# Patient Record
Sex: Male | Born: 1955 | Race: White | Hispanic: No | Marital: Married | Smoking: Current every day smoker
Health system: Southern US, Community
[De-identification: ages and names within clinical notes are randomized; demographics above are authoritative.]

## PROBLEM LIST (undated history)

## (undated) DIAGNOSIS — K219 Gastro-esophageal reflux disease without esophagitis: Secondary | ICD-10-CM

## (undated) DIAGNOSIS — B182 Chronic viral hepatitis C: Secondary | ICD-10-CM

## (undated) DIAGNOSIS — K769 Liver disease, unspecified: Secondary | ICD-10-CM

## (undated) HISTORY — PX: UPPER GASTROINTESTINAL ENDOSCOPY: SHX188

## (undated) HISTORY — PX: APPENDECTOMY: SHX54

## (undated) HISTORY — PX: BACK SURGERY: SHX140

---

## 1993-02-07 HISTORY — PX: ANTERIOR CRUCIATE LIGAMENT REPAIR: SHX115

## 1994-02-07 HISTORY — PX: ANTERIOR CRUCIATE LIGAMENT REPAIR: SHX115

## 1997-05-25 ENCOUNTER — Emergency Department (HOSPITAL_COMMUNITY): Admission: EM | Admit: 1997-05-25 | Discharge: 1997-05-25 | Payer: Self-pay | Admitting: *Deleted

## 1998-03-16 ENCOUNTER — Ambulatory Visit (HOSPITAL_COMMUNITY): Admission: RE | Admit: 1998-03-16 | Discharge: 1998-03-16 | Payer: Self-pay | Admitting: *Deleted

## 1998-03-16 ENCOUNTER — Encounter: Payer: Self-pay | Admitting: *Deleted

## 1998-12-23 ENCOUNTER — Ambulatory Visit (HOSPITAL_COMMUNITY): Admission: RE | Admit: 1998-12-23 | Discharge: 1998-12-23 | Payer: Self-pay | Admitting: Gastroenterology

## 1998-12-23 ENCOUNTER — Encounter (INDEPENDENT_AMBULATORY_CARE_PROVIDER_SITE_OTHER): Payer: Self-pay | Admitting: Specialist

## 1999-03-05 ENCOUNTER — Encounter (INDEPENDENT_AMBULATORY_CARE_PROVIDER_SITE_OTHER): Payer: Self-pay | Admitting: Specialist

## 1999-03-05 ENCOUNTER — Ambulatory Visit (HOSPITAL_COMMUNITY): Admission: RE | Admit: 1999-03-05 | Discharge: 1999-03-05 | Payer: Self-pay | Admitting: Gastroenterology

## 1999-03-05 ENCOUNTER — Encounter: Payer: Self-pay | Admitting: Gastroenterology

## 1999-05-30 ENCOUNTER — Emergency Department (HOSPITAL_COMMUNITY): Admission: EM | Admit: 1999-05-30 | Discharge: 1999-05-30 | Payer: Self-pay | Admitting: Emergency Medicine

## 1999-06-02 ENCOUNTER — Encounter: Admission: RE | Admit: 1999-06-02 | Discharge: 1999-07-12 | Payer: Self-pay | Admitting: Family Medicine

## 1999-07-28 ENCOUNTER — Encounter: Payer: Self-pay | Admitting: Family Medicine

## 1999-07-28 ENCOUNTER — Ambulatory Visit (HOSPITAL_COMMUNITY): Admission: RE | Admit: 1999-07-28 | Discharge: 1999-07-28 | Payer: Self-pay | Admitting: Family Medicine

## 1999-08-12 ENCOUNTER — Encounter: Payer: Self-pay | Admitting: Family Medicine

## 1999-08-12 ENCOUNTER — Ambulatory Visit (HOSPITAL_COMMUNITY): Admission: RE | Admit: 1999-08-12 | Discharge: 1999-08-12 | Payer: Self-pay | Admitting: Family Medicine

## 1999-08-26 ENCOUNTER — Encounter: Payer: Self-pay | Admitting: Family Medicine

## 1999-08-26 ENCOUNTER — Ambulatory Visit (HOSPITAL_COMMUNITY): Admission: RE | Admit: 1999-08-26 | Discharge: 1999-08-26 | Payer: Self-pay | Admitting: Family Medicine

## 1999-12-13 ENCOUNTER — Encounter: Payer: Self-pay | Admitting: Neurosurgery

## 1999-12-13 ENCOUNTER — Ambulatory Visit (HOSPITAL_COMMUNITY): Admission: RE | Admit: 1999-12-13 | Discharge: 1999-12-13 | Payer: Self-pay | Admitting: Neurosurgery

## 1999-12-15 ENCOUNTER — Encounter: Payer: Self-pay | Admitting: Neurosurgery

## 1999-12-15 ENCOUNTER — Ambulatory Visit (HOSPITAL_COMMUNITY): Admission: RE | Admit: 1999-12-15 | Discharge: 1999-12-15 | Payer: Self-pay | Admitting: Neurosurgery

## 2000-01-03 ENCOUNTER — Encounter: Payer: Self-pay | Admitting: Neurosurgery

## 2000-01-04 ENCOUNTER — Encounter: Payer: Self-pay | Admitting: Neurosurgery

## 2000-01-04 ENCOUNTER — Encounter (INDEPENDENT_AMBULATORY_CARE_PROVIDER_SITE_OTHER): Payer: Self-pay | Admitting: Specialist

## 2000-01-04 ENCOUNTER — Ambulatory Visit (HOSPITAL_COMMUNITY): Admission: RE | Admit: 2000-01-04 | Discharge: 2000-01-05 | Payer: Self-pay | Admitting: Neurosurgery

## 2000-01-19 ENCOUNTER — Ambulatory Visit (HOSPITAL_COMMUNITY): Admission: RE | Admit: 2000-01-19 | Discharge: 2000-01-20 | Payer: Self-pay | Admitting: Neurosurgery

## 2000-01-20 ENCOUNTER — Encounter: Payer: Self-pay | Admitting: Neurosurgery

## 2000-04-20 ENCOUNTER — Encounter: Payer: Self-pay | Admitting: Neurosurgery

## 2000-04-20 ENCOUNTER — Ambulatory Visit (HOSPITAL_COMMUNITY): Admission: RE | Admit: 2000-04-20 | Discharge: 2000-04-20 | Payer: Self-pay | Admitting: Neurosurgery

## 2001-11-12 ENCOUNTER — Emergency Department (HOSPITAL_COMMUNITY): Admission: EM | Admit: 2001-11-12 | Discharge: 2001-11-12 | Payer: Self-pay | Admitting: Emergency Medicine

## 2001-11-13 ENCOUNTER — Ambulatory Visit (HOSPITAL_COMMUNITY): Admission: RE | Admit: 2001-11-13 | Discharge: 2001-11-13 | Payer: Self-pay | Admitting: Emergency Medicine

## 2002-12-08 ENCOUNTER — Emergency Department (HOSPITAL_COMMUNITY): Admission: EM | Admit: 2002-12-08 | Discharge: 2002-12-08 | Payer: Self-pay | Admitting: *Deleted

## 2003-06-16 ENCOUNTER — Emergency Department (HOSPITAL_COMMUNITY): Admission: EM | Admit: 2003-06-16 | Discharge: 2003-06-16 | Payer: Self-pay | Admitting: Emergency Medicine

## 2003-08-28 ENCOUNTER — Emergency Department (HOSPITAL_COMMUNITY): Admission: EM | Admit: 2003-08-28 | Discharge: 2003-08-28 | Payer: Self-pay | Admitting: Emergency Medicine

## 2005-07-21 IMAGING — CR DG CHEST 2V
2 series · 2 of 2 positions shown · non-contrast
Comparison: none

CLINICAL DATA: Left chest injury.  Swelling.  Chest pain and shortness of breath.  
 CHEST (TWO VIEWS)
 PA and lateral views of the chest are made without previous films for direct comparison at this time and show the aorta to be mildly elongated and minimally calcified.  The heart appears normal.  The lungs appear clear.  There is no pneumothorax or pleural effusion.  No definite rib fracture is seen.  The upper and mid body of the sternum are well seen and appear normal.  The xiphoid area is not seen on this study in the lateral projection.  
 IMPRESSION
 Mild aortic elongation and calcification.  No evidence of acute disease of the chest.  No pneumothorax or pleural effusion is identified.  The lungs appear clear.  The heart is normal.

[view not recorded (1 of 2)]
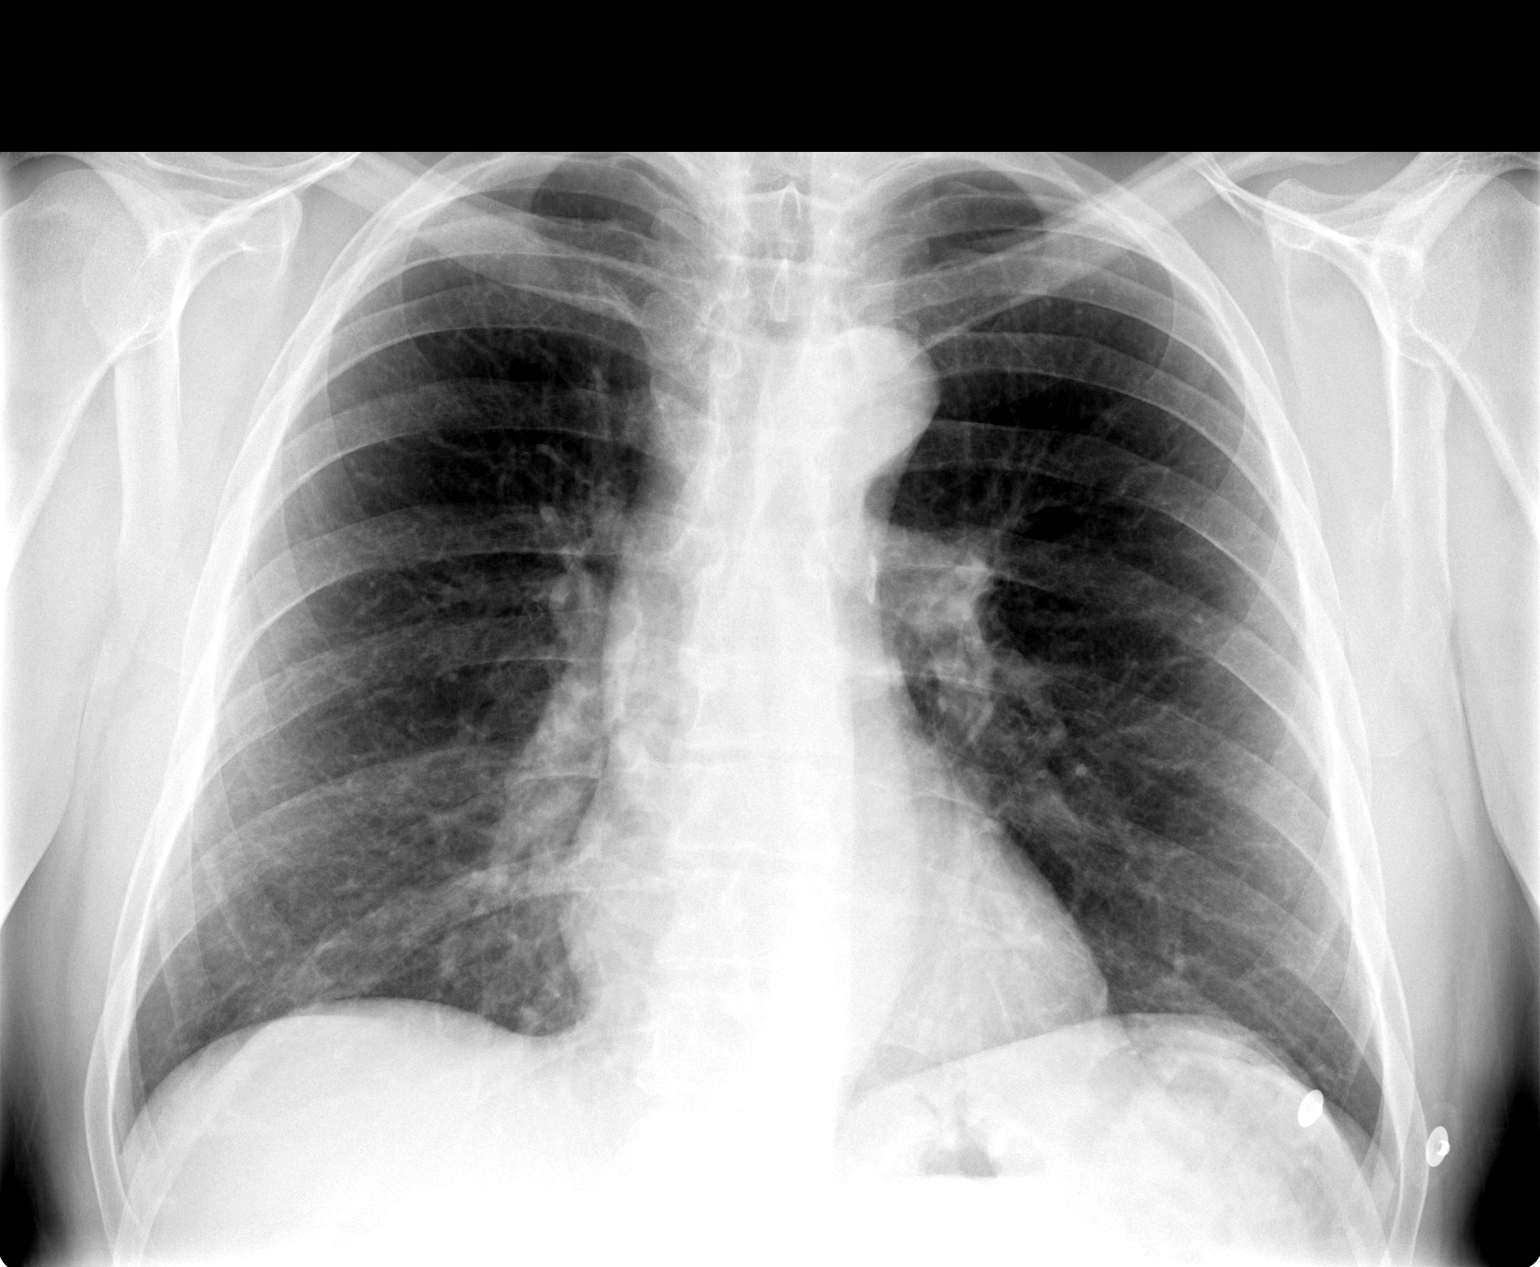

[view not recorded (2 of 2)]
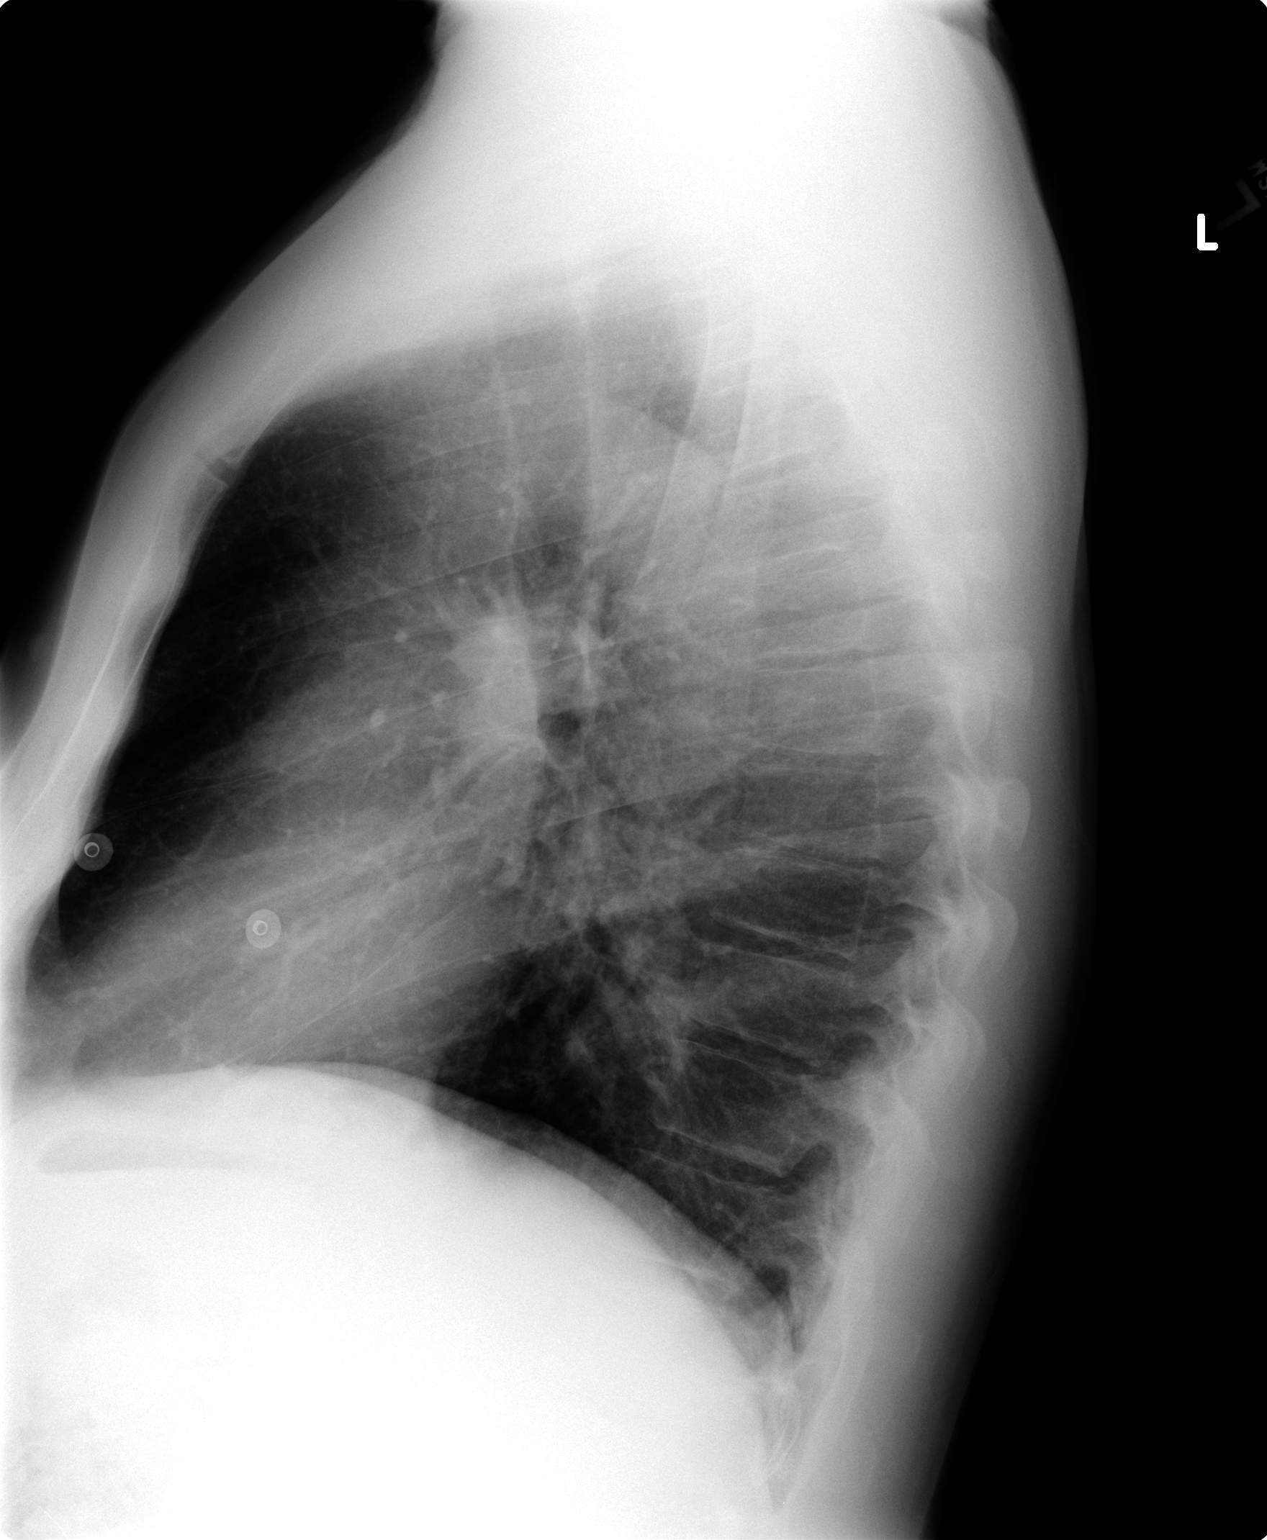

[2 of 2 positions shown; findings below may reference images not displayed]

## 2006-12-16 ENCOUNTER — Emergency Department (HOSPITAL_COMMUNITY): Admission: EM | Admit: 2006-12-16 | Discharge: 2006-12-16 | Payer: Self-pay | Admitting: Emergency Medicine

## 2009-03-10 ENCOUNTER — Ambulatory Visit: Payer: Self-pay | Admitting: Diagnostic Radiology

## 2009-03-10 ENCOUNTER — Emergency Department (HOSPITAL_BASED_OUTPATIENT_CLINIC_OR_DEPARTMENT_OTHER): Admission: EM | Admit: 2009-03-10 | Discharge: 2009-03-10 | Payer: Self-pay | Admitting: Emergency Medicine

## 2009-03-18 ENCOUNTER — Emergency Department (HOSPITAL_BASED_OUTPATIENT_CLINIC_OR_DEPARTMENT_OTHER): Admission: EM | Admit: 2009-03-18 | Discharge: 2009-03-18 | Payer: Self-pay | Admitting: Emergency Medicine

## 2009-10-18 ENCOUNTER — Emergency Department (HOSPITAL_BASED_OUTPATIENT_CLINIC_OR_DEPARTMENT_OTHER): Admission: EM | Admit: 2009-10-18 | Discharge: 2009-10-18 | Payer: Self-pay | Admitting: Emergency Medicine

## 2009-11-15 ENCOUNTER — Emergency Department (HOSPITAL_BASED_OUTPATIENT_CLINIC_OR_DEPARTMENT_OTHER): Admission: EM | Admit: 2009-11-15 | Discharge: 2009-11-15 | Payer: Self-pay | Admitting: Emergency Medicine

## 2009-11-15 ENCOUNTER — Ambulatory Visit: Payer: Self-pay | Admitting: Diagnostic Radiology

## 2010-04-22 LAB — POCT CARDIAC MARKERS
Myoglobin, poc: 33.4 ng/mL (ref 12–200)
Troponin i, poc: <0.05 ng/mL (ref 0.00–0.09)
Troponin i, poc: <0.05 ng/mL (ref 0.00–0.09)

## 2010-04-22 LAB — BASIC METABOLIC PANEL
Calcium: 8.8 mg/dL (ref 8.4–10.5)
Creatinine, Ser: 1.2 mg/dL (ref 0.4–1.5)
GFR calc Af Amer: >60 mL/min (ref >60)
GFR calc non Af Amer: >60 mL/min (ref >60)
Sodium: 140 mEq/L (ref 135–145)

## 2010-04-22 LAB — CBC
Hemoglobin: 14.3 g/dL (ref 13.0–17.0)
Platelets: 76 10*3/uL — ABNORMAL LOW (ref 150–400)
RBC: 4.35 MIL/uL (ref 4.22–5.81)
WBC: 5.9 10*3/uL (ref 4.0–10.5)

## 2010-04-22 LAB — RAPID STREP SCREEN (MED CTR MEBANE ONLY): Streptococcus, Group A Screen (Direct): NEGATIVE

## 2010-04-22 LAB — DIFFERENTIAL
Eosinophils Absolute: 0.2 10*3/uL (ref 0.0–0.7)
Lymphs Abs: 1.8 10*3/uL (ref 0.7–4.0)
Monocytes Relative: 10 % (ref 3–12)
Neutro Abs: 3.3 10*3/uL (ref 1.7–7.7)
Neutrophils Relative %: 56 % (ref 43–77)

## 2010-04-22 LAB — D-DIMER, QUANTITATIVE: D-Dimer, Quant: <0.22 ug/mL-FEU (ref 0.00–0.48)

## 2010-06-25 NOTE — Op Note (Signed)
[place]  Patient:    Tom Chang, SCHLOEMER                     MRN: [id number] Proc. Date: 01/19/00 Adm. Date:  [id number] Disc. Date: [id number] Attending:  Donalee Citrin P                           Operative Report  PREOPERATIVE DIAGNOSIS:  Wound infection.  PROCEDURE:  Incision and drainage of wound infection, status post lumbar laminectomy two weeks ago.  SURGEON:  Donzetta Sprung. Roney Jaffe., M.D.  ANESTHESIA:  General endotracheal.  IV FLUIDS:  Less than 1 L.  ESTIMATED BLOOD LOSS:  Minimal.  CLINICAL NOTE:  The patient is a very pleasant 55 year old gentleman who underwent lumbar laminectomy for removal of an L3-4 facet mass approximately two weeks ago.  Presented to clinic with a swollen wound, erythematous, indurated, and fever of 102.  The patient was emergently brought to the OR that afternoon and underwent reopening of previous incision, and purulent fluid was noted to be immediately visible from underneath the superficial skin layer.  This was sent for culture, both aerobic and anaerobic, and the deep layer of fascia was opened up and explored.  There was also purulent fluid noted to be subfascial.  This was copiously irrigated with 6 L of bacitracin irrigation through a Pulsavac.  At the end of the irrigation, the remainder of the necrotic tissue was incised away, and the skin edge was re-incised to get a fresh skin edge for closure.  The tissue was noted to be bleeding profusely, and this was coagulated but noted to have good vascularization.  At the end, meticulous hemostasis was maintained.  The wound was closed again, the deep layer with 0 interrupted Vicryl, 2-0 interrupted Vicryl in the subcutaneous tissue, and a 4-0 running subcuticular in the skin.  Benzoin and Steri-Strips were applied, and the patient went to the recovery room in stable condition. At the end of the case, all needle counts and sponge counts were correct.  DESCRIPTION  OF PROCEDURE: DD:  02/03/00 TD:  02/03/00 Job: [id number]

## 2010-06-25 NOTE — Op Note (Signed)
[place]. [facility]  Patient:    Tom Chang, Tom Chang                     MRN: [id number] Adm. Date:  [id number] Attending:  Donalee Citrin P                           Operative Report  PREOPERATIVE DIAGNOSIS:  L3-4 facet mass.  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  PROCEDURE:  Lumbar laminectomy and medial facetectomy for excision of well-circumscribed mass at the L3-4 facet.  SURGEON:  Donzetta Sprung. Roney Jaffe., M.D.  ASSISTANT:  Reinaldo Meeker, M.D.  INTRAOPERATIVE FLUIDS:  1200.  ESTIMATED BLOOD LOSS:  Less than 150.  CLINICAL NOTE:  The patient is a very pleasant 55 year old gentleman who injured his back at work several months ago and has failed all trials of conservative therapy.  Initial MRI was read as negative.  Follow-up MRI revealed an enlarging cystic mass located at the lateral L3-4 facet complex, based off of the superior articulating facet of L4 at the level of the L4 pedicle.  The patient did not respond to all anti-inflammatories.  DESCRIPTION OF PROCEDURE:  The patient was brought in the OR, was administered general anesthesia, was positioned prone in the Hazen frame.  His back was prepped and draped in the usual sterile fashion.  A midline incision was made after localization of the L4 pedicle with preoperative x-ray.  With an 11 blade scalpel, an incision was taken down through subcutaneous tissue on the right side.  Subperiosteal dissection was carried out on the lamina of L3 and L4, exposing the facet complex.  There was a large bony protuberance coming off the medial aspect of the facet complex and the superior aspect of the L4 lamina.  This was further dissected out and identified.  Intraoperative x-ray confirmed the L4 pedicle, and this laminotomy was begun on the inferior aspect of L3, exposing the ligamentum flavum, and Leksell rongeur was used to remove part of this large bony mass, sent for pathology.  The remainder of  the mass was then freed up off the medial aspect of the facet, and it was noted to be well-circumscribed, not invading the soft tissues.  It appeared to be entirely just involving the bone of the lamina and the medial aspect of the facet.  The remainder of this mass was removed en bloc with just a little bit of residual remaining tethered to the L3 lamina.  This was removed in piecemeal fashion with a 2 and 3 mm Kerrison punch.  At the end of the resection, the mass was no longer present and was completely removed.  The dura was then visualized, and it was noted to be not under any compression.  The wound was copiously irrigated and meticulous hemostasis was maintained.  The residual aspect of the facet complex of L3 was coagulated, and bone wax was used to maintain hemostasis.  Gelfoam was overlaid, and the wound was copiously irrigated and then closed with 0 interrupted Vicryl in the fascia, 2-0 and 3-0 interrupted Vicryl in the subcutaneous tissue, and a 4-0 running subcuticular.  Prior to closure, the towel clip was attached to the L3 and L4 spinous processes, and very minimal movement was noted at this complex.  It was felt this was stable and did not require additional fusion, and a _____ dressing was applied, benzoin and Steri-Strips, and the patient went  to the recovery room in stable condition. DD:  01/04/00 TD:  01/04/00 Job: [id number]

## 2010-07-26 ENCOUNTER — Emergency Department (HOSPITAL_BASED_OUTPATIENT_CLINIC_OR_DEPARTMENT_OTHER)
Admission: EM | Admit: 2010-07-26 | Discharge: 2010-07-26 | Disposition: A | Discharge status: Home / Self Care | Payer: 59 | Attending: Emergency Medicine | Admitting: Emergency Medicine

## 2010-07-26 DIAGNOSIS — K089 Disorder of teeth and supporting structures, unspecified: Secondary | ICD-10-CM | POA: Insufficient documentation

## 2010-07-26 DIAGNOSIS — M549 Dorsalgia, unspecified: Secondary | ICD-10-CM | POA: Insufficient documentation

## 2010-07-26 DIAGNOSIS — G8929 Other chronic pain: Secondary | ICD-10-CM | POA: Insufficient documentation

## 2010-10-06 ENCOUNTER — Emergency Department (HOSPITAL_BASED_OUTPATIENT_CLINIC_OR_DEPARTMENT_OTHER)
Admission: EM | Admit: 2010-10-06 | Discharge: 2010-10-06 | Disposition: A | Discharge status: Home / Self Care | Payer: 59 | Attending: Emergency Medicine | Admitting: Emergency Medicine

## 2010-10-06 ENCOUNTER — Encounter: Payer: Self-pay | Admitting: *Deleted

## 2010-10-06 ENCOUNTER — Emergency Department (INDEPENDENT_AMBULATORY_CARE_PROVIDER_SITE_OTHER): Payer: 59

## 2010-10-06 DIAGNOSIS — S2341XA Sprain of ribs, initial encounter: Secondary | ICD-10-CM

## 2010-10-06 DIAGNOSIS — R079 Chest pain, unspecified: Secondary | ICD-10-CM

## 2010-10-06 DIAGNOSIS — W010XXA Fall on same level from slipping, tripping and stumbling without subsequent striking against object, initial encounter: Secondary | ICD-10-CM | POA: Insufficient documentation

## 2010-10-06 DIAGNOSIS — Y9289 Other specified places as the place of occurrence of the external cause: Secondary | ICD-10-CM | POA: Insufficient documentation

## 2010-10-06 DIAGNOSIS — F17200 Nicotine dependence, unspecified, uncomplicated: Secondary | ICD-10-CM | POA: Insufficient documentation

## 2010-10-06 HISTORY — DX: Gastro-esophageal reflux disease without esophagitis: K21.9

## 2010-10-06 MED ORDER — OXYCODONE-ACETAMINOPHEN 5-325 MG PO TABS
2.0000 | ORAL_TABLET | Freq: Once | ORAL | Status: DC
  Filled 2010-10-06: qty 2

## 2010-10-06 MED ORDER — OXYCODONE-ACETAMINOPHEN 5-325 MG PO TABS: 1.0000 | ORAL_TABLET | Freq: Four times a day (QID) | ORAL | Status: AC | PRN

## 2010-10-06 NOTE — ED Provider Notes (Signed)
History     CSN: [id number] Arrival date & time: 10/06/2010  7:57 AM  Chief Complaint  Patient presents with  . Chest Pain   HPI Comments: The history is obtained from the patient. The patient reports about 10 days ago he slipped on some wet grass and fell over and over small tree trunk hitting his left rib cage. Patient reports he did have pain in that area as well as bruising. The bruising and pain did improve in a few days ago he was working in the yard rather strenuously. He did a lot of lifting and pushing which is his usual. Reports that he had a sudden increase in pain following physical exertion focal to that area again. She reports now with deep breathing or coughing he feels like there is bone that is moving in the in that area as well as hearing a bony crunching noise. She denies any fever or significant productive cough. He does have sensation to cough and it is difficult to do so. He denies any abdominal discomfort, lightheadedness, sweats or nausea. He has not been taking any medications for this. The patient is otherwise healthy except for he has had multiple orthopedic surgeries in the past. She reports the other physical exam is primary care Dr. at the [facility] in [place] and did fine.  Patient is a 55 y.o. male presenting with chest pain.  Chest Pain Pertinent negatives for primary symptoms include no shortness of breath, no wheezing, no palpitations, no abdominal pain, no nausea and no vomiting.  Pertinent negatives for associated symptoms include no numbness and no weakness.     Past Medical History  Diagnosis Date  . Acid reflux     Past Surgical History  Procedure Date  . Upper gastrointestinal endoscopy   . Back surgery   . Anterior cruciate ligament repair 1996    x 2 left knee  . Appendectomy     History reviewed. No pertinent family history.  History  Substance Use Topics  . Smoking status: Current Everyday Smoker -- 0.5 packs/day for 40 years    Types:  Cigarettes  . Smokeless tobacco: Not on file  . Alcohol Use: 2.4 oz/week    4 Cans of beer per week     drinks weekends only      Review of Systems  HENT: Negative for congestion and rhinorrhea.   Respiratory: Negative for chest tightness, shortness of breath, wheezing and stridor.   Cardiovascular: Positive for chest pain. Negative for palpitations and leg swelling.  Gastrointestinal: Negative for nausea, vomiting, abdominal pain and abdominal distention.  Genitourinary: Negative for flank pain.  Musculoskeletal: Negative for back pain.  Neurological: Negative for weakness and numbness.  All other systems reviewed and are negative.    Physical Exam  BP 118/72  Pulse 71  Temp(Src) 98.3 F (36.8 C) (Oral)  Resp 24  Ht 6\' 1"  (1.854 m)  Wt 225 lb (102.059 kg)  BMI 29.69 kg/m2  SpO2 94%  Physical Exam  Constitutional: He appears well-developed and well-nourished.  HENT:  Head: Atraumatic.  Eyes: Pupils are equal, round, and reactive to light.  Neck: Normal range of motion. Neck supple.  Cardiovascular: Normal rate and regular rhythm.   Pulmonary/Chest: Effort normal. No respiratory distress. He has no wheezes. He exhibits tenderness.  Abdominal: He exhibits no distension. There is no tenderness. There is no rebound and no guarding.  Musculoskeletal: Normal range of motion. He exhibits no tenderness.  Skin: Skin is warm and dry. No  rash noted.  Psychiatric: He has a normal mood and affect.    ED Course  Procedures  MDM Plain films of ribs shows no acute fractures. Pt is reassured.  sats are normal on RA.  Pt refused percocet here.  Will refer him back to PCP for follow up      Gavin Pound. Oletta Lamas, MD 10/06/10 [phone]

## 2010-10-06 NOTE — ED Notes (Signed)
Patient states he was walking down a hill on Saturday 09/25/10 , the grass was wet and he slipped and fell over a tree striking his left chest.  States he has had increasing pain and now is feeling sob and a "popping" in his chest with each breath.

## 2010-10-17 ENCOUNTER — Encounter (HOSPITAL_BASED_OUTPATIENT_CLINIC_OR_DEPARTMENT_OTHER): Payer: Self-pay | Admitting: Emergency Medicine

## 2010-10-17 ENCOUNTER — Emergency Department (HOSPITAL_BASED_OUTPATIENT_CLINIC_OR_DEPARTMENT_OTHER)
Admission: EM | Admit: 2010-10-17 | Discharge: 2010-10-17 | Disposition: A | Discharge status: Home / Self Care | Payer: 59 | Attending: Emergency Medicine | Admitting: Emergency Medicine

## 2010-10-17 DIAGNOSIS — F17200 Nicotine dependence, unspecified, uncomplicated: Secondary | ICD-10-CM | POA: Insufficient documentation

## 2010-10-17 DIAGNOSIS — M25569 Pain in unspecified knee: Secondary | ICD-10-CM | POA: Insufficient documentation

## 2010-10-17 DIAGNOSIS — L089 Local infection of the skin and subcutaneous tissue, unspecified: Secondary | ICD-10-CM | POA: Insufficient documentation

## 2010-10-17 MED ORDER — HYDROMORPHONE HCL 1 MG/ML IJ SOLN
1.0000 mg | Freq: Once | INTRAMUSCULAR | Status: AC
  Administered 2010-10-17: 1 mg via INTRAVENOUS
  Filled 2010-10-17: qty 1

## 2010-10-17 MED ORDER — DOXYCYCLINE HYCLATE 100 MG PO CAPS: 100.0000 mg | ORAL_CAPSULE | Freq: Two times a day (BID) | ORAL | Status: AC

## 2010-10-17 MED ORDER — DOXYCYCLINE HYCLATE 100 MG PO TABS
100.0000 mg | ORAL_TABLET | Freq: Once | ORAL | Status: AC
  Administered 2010-10-17: 100 mg via ORAL
  Filled 2010-10-17 (×2): qty 1

## 2010-10-17 MED ORDER — ONDANSETRON HCL 4 MG/2ML IJ SOLN
4.0000 mg | Freq: Once | INTRAMUSCULAR | Status: AC
  Administered 2010-10-17: 4 mg via INTRAVENOUS
  Filled 2010-10-17: qty 2

## 2010-10-17 MED ORDER — CEPHALEXIN 500 MG PO CAPS: 500.0000 mg | ORAL_CAPSULE | Freq: Four times a day (QID) | ORAL | Status: AC

## 2010-10-17 MED ORDER — SODIUM CHLORIDE 0.9 % IV SOLN
Freq: Once | INTRAVENOUS | Status: AC
  Administered 2010-10-17: 1000 mL via INTRAVENOUS

## 2010-10-17 MED ORDER — OXYCODONE-ACETAMINOPHEN 5-325 MG PO TABS: 2.0000 | ORAL_TABLET | ORAL | Status: AC | PRN

## 2010-10-17 MED ORDER — DEXTROSE 5 % IV SOLN
1.0000 g | INTRAVENOUS | Status: DC
  Administered 2010-10-17: 1 g via INTRAVENOUS

## 2010-10-17 MED ORDER — DEXTROSE 5 % IV SOLN
INTRAVENOUS | Status: AC
  Filled 2010-10-17: qty 50

## 2010-10-17 MED ORDER — DEXTROSE 5 % IV SOLN
INTRAVENOUS | Status: AC
  Administered 2010-10-17: 1 g via INTRAVENOUS
  Filled 2010-10-17: qty 1

## 2010-10-17 NOTE — ED Provider Notes (Signed)
Medical screening examination/treatment/procedure(s) were performed by non-physician practitioner and as supervising physician I was immediately available for consultation/collaboration.  Doug Sou, MD 10/17/10 [phone]

## 2010-10-17 NOTE — ED Notes (Signed)
Pt states he was stung by catfish horn.  Pt has redness, swelling and inflammation to right knee.  Pt states it is very painful.

## 2010-10-17 NOTE — ED Provider Notes (Signed)
History     CSN: [id number] Arrival date & time: 10/17/2010 10:58 AM  Chief Complaint  Patient presents with  . Wound Infection  . Knee Pain   Patient is a 55 y.o. male presenting with knee pain. The history is provided by the patient. No language interpreter was used.  Knee Pain The current episode started in the past 7 days. The problem occurs constantly. The problem has been unchanged. Associated symptoms include joint swelling. The symptoms are aggravated by walking. He has tried nothing for the symptoms. The treatment provided moderate relief.  Pt complains of pain and swelling to left knee after being struck by a catfish barb.   Pt reports area is now red and swollen.  Pt complains of pain with moving.  Past Medical History  Diagnosis Date  . Acid reflux     Past Surgical History  Procedure Date  . Upper gastrointestinal endoscopy   . Back surgery   . Anterior cruciate ligament repair 1996    x 2 left knee  . Appendectomy     History reviewed. No pertinent family history.  History  Substance Use Topics  . Smoking status: Current Everyday Smoker -- 0.5 packs/day for 40 years    Types: Cigarettes  . Smokeless tobacco: Not on file  . Alcohol Use: 2.4 oz/week    4 Cans of beer per week     drinks weekends only      Review of Systems  Musculoskeletal: Positive for joint swelling.  All other systems reviewed and are negative.    Physical Exam  BP 107/67  Pulse 65  Temp(Src) 99.5 F (37.5 C) (Oral)  Resp 22  Ht 6\' 1"  (1.854 m)  Wt 230 lb (104.327 kg)  BMI 30.34 kg/m2  SpO2 94%  Physical Exam  Nursing note and vitals reviewed. Constitutional: He is oriented to person, place, and time. He appears well-developed and well-nourished.  HENT:  Head: Normocephalic.  Eyes: Pupils are equal, round, and reactive to light.  Neck: Normal range of motion.  Pulmonary/Chest: Effort normal.  Abdominal: Soft.  Musculoskeletal: He exhibits edema and tenderness.   Red swollen area left knee,  Warm to touch  Neurological: He is alert and oriented to person, place, and time. He has normal reflexes.  Skin: There is erythema.  Psychiatric: He has a normal mood and affect.    ED Course  Procedures  MDM  Pt advised to return here or see Dr. Ivory Broad for recheck tommoros.      Langston Masker, [place] 10/17/10 1441

## 2010-10-18 ENCOUNTER — Encounter (HOSPITAL_BASED_OUTPATIENT_CLINIC_OR_DEPARTMENT_OTHER): Payer: Self-pay | Admitting: Student

## 2010-10-18 ENCOUNTER — Emergency Department (HOSPITAL_BASED_OUTPATIENT_CLINIC_OR_DEPARTMENT_OTHER)
Admission: EM | Admit: 2010-10-18 | Discharge: 2010-10-18 | Disposition: A | Discharge status: Home / Self Care | Payer: 59 | Attending: Emergency Medicine | Admitting: Emergency Medicine

## 2010-10-18 DIAGNOSIS — M25569 Pain in unspecified knee: Secondary | ICD-10-CM | POA: Insufficient documentation

## 2010-10-18 DIAGNOSIS — F17200 Nicotine dependence, unspecified, uncomplicated: Secondary | ICD-10-CM | POA: Insufficient documentation

## 2010-10-18 DIAGNOSIS — L0390 Cellulitis, unspecified: Secondary | ICD-10-CM

## 2010-10-18 DIAGNOSIS — L02419 Cutaneous abscess of limb, unspecified: Secondary | ICD-10-CM | POA: Insufficient documentation

## 2010-10-18 MED ORDER — HYDROMORPHONE HCL 1 MG/ML IJ SOLN
1.0000 mg | Freq: Once | INTRAMUSCULAR | Status: AC
  Administered 2010-10-18: 1 mg via INTRAVENOUS
  Filled 2010-10-18: qty 1

## 2010-10-18 MED ORDER — ONDANSETRON HCL 4 MG/2ML IJ SOLN
4.0000 mg | Freq: Once | INTRAMUSCULAR | Status: AC
  Administered 2010-10-18: 4 mg via INTRAVENOUS
  Filled 2010-10-18: qty 2

## 2010-10-18 MED ORDER — DEXTROSE 5 % IV SOLN
1.0000 g | INTRAVENOUS | Status: DC
  Administered 2010-10-18: 1 g via INTRAVENOUS
  Filled 2010-10-18: qty 1

## 2010-10-18 MED ORDER — VANCOMYCIN HCL IN DEXTROSE 1-5 GM/200ML-% IV SOLN
1000.0000 mg | Freq: Once | INTRAVENOUS | Status: AC
  Administered 2010-10-18: 1000 mg via INTRAVENOUS
  Filled 2010-10-18: qty 200

## 2010-10-18 NOTE — ED Notes (Signed)
Pt seen here yesterday for pain to right knee s/p catfish sting. Pt sts he did get his Rx's filled but took them this a.m. on an empty stomach and vomited them back up. Pt sts he was able to eat later and then took his antibiotics but not the pain pill. Wound does not appear to have spread beyond outline made yesterday. Knee is still warm to the touch.

## 2010-10-18 NOTE — ED Provider Notes (Signed)
History     CSN: [id number] Arrival date & time: 10/18/2010  3:42 PM  Chief Complaint  Patient presents with  . Knee Pain    right knee   Patient is a 55 y.o. male presenting with knee pain. The history is provided by the patient. No language interpreter was used.  Knee Pain This is a new problem. The current episode started in the past 7 days. The problem occurs constantly. The problem has been gradually worsening. Associated symptoms include myalgias. The symptoms are aggravated by nothing. He has tried nothing for the symptoms. The treatment provided moderate relief.  Pt here for recheck of cellulitis to right leg.  Past Medical History  Diagnosis Date  . Acid reflux     Past Surgical History  Procedure Date  . Upper gastrointestinal endoscopy   . Back surgery   . Anterior cruciate ligament repair 1996    x 2 left knee  . Appendectomy     History reviewed. No pertinent family history.  History  Substance Use Topics  . Smoking status: Current Everyday Smoker -- 0.5 packs/day for 40 years    Types: Cigarettes  . Smokeless tobacco: Not on file  . Alcohol Use: 2.4 oz/week    4 Cans of beer per week     drinks weekends only      Review of Systems  Musculoskeletal: Positive for myalgias.  All other systems reviewed and are negative.    Physical Exam  BP 125/92  Pulse 85  Temp(Src) 99.9 F (37.7 C) (Oral)  Resp 18  Wt 230 lb (104.327 kg)  SpO2 95%  Physical Exam  Nursing note and vitals reviewed. Constitutional: He is oriented to person, place, and time. He appears well-developed and well-nourished.  HENT:  Head: Normocephalic.  Eyes: Pupils are equal, round, and reactive to light.  Musculoskeletal: He exhibits tenderness.  Neurological: He is alert and oriented to person, place, and time.  Skin: There is erythema.  Psychiatric: He has a normal mood and affect.  decreased redness right leg,    ED Course  Procedures  MDM Pt given Iv Rocephin and  Vancomycin second dosage.   Pt advised to continue oral antibiotics,  Pt to return if symptoms worsen or change.      Langston Masker, [place] 10/18/10 [phone]

## 2010-10-18 NOTE — ED Notes (Signed)
Pt in with c/o continued right knee pain s/p visit 09/10 - s/p catfish stings to right knee on Wednesday.

## 2010-10-18 NOTE — ED Notes (Signed)
Pt verbalized understanding to not drive for 5 more hours due to meds given.

## 2010-10-19 NOTE — ED Provider Notes (Signed)
History/physical exam/procedure(s) were performed by non-physician practitioner and as supervising physician I was immediately available for consultation/collaboration. I have reviewed all notes and am in agreement with care and plan.   Hilario Quarry, MD 10/19/10 [phone]

## 2010-10-27 ENCOUNTER — Encounter (HOSPITAL_BASED_OUTPATIENT_CLINIC_OR_DEPARTMENT_OTHER): Payer: 59

## 2010-11-24 ENCOUNTER — Encounter (HOSPITAL_BASED_OUTPATIENT_CLINIC_OR_DEPARTMENT_OTHER): Payer: 59

## 2013-04-11 ENCOUNTER — Encounter (HOSPITAL_BASED_OUTPATIENT_CLINIC_OR_DEPARTMENT_OTHER): Payer: Self-pay | Admitting: Emergency Medicine

## 2013-04-11 ENCOUNTER — Emergency Department (HOSPITAL_BASED_OUTPATIENT_CLINIC_OR_DEPARTMENT_OTHER): Payer: BC Managed Care – PPO

## 2013-04-11 ENCOUNTER — Emergency Department (HOSPITAL_BASED_OUTPATIENT_CLINIC_OR_DEPARTMENT_OTHER)
Admission: EM | Admit: 2013-04-11 | Discharge: 2013-04-11 | Disposition: A | Discharge status: Home / Self Care | Payer: BC Managed Care – PPO | Attending: Emergency Medicine | Admitting: Emergency Medicine

## 2013-04-11 DIAGNOSIS — W19XXXA Unspecified fall, initial encounter: Secondary | ICD-10-CM

## 2013-04-11 DIAGNOSIS — Z79899 Other long term (current) drug therapy: Secondary | ICD-10-CM | POA: Insufficient documentation

## 2013-04-11 DIAGNOSIS — K219 Gastro-esophageal reflux disease without esophagitis: Secondary | ICD-10-CM | POA: Insufficient documentation

## 2013-04-11 DIAGNOSIS — R0609 Other forms of dyspnea: Secondary | ICD-10-CM | POA: Insufficient documentation

## 2013-04-11 DIAGNOSIS — M549 Dorsalgia, unspecified: Secondary | ICD-10-CM

## 2013-04-11 DIAGNOSIS — R0602 Shortness of breath: Secondary | ICD-10-CM | POA: Insufficient documentation

## 2013-04-11 DIAGNOSIS — F17200 Nicotine dependence, unspecified, uncomplicated: Secondary | ICD-10-CM | POA: Insufficient documentation

## 2013-04-11 DIAGNOSIS — R0989 Other specified symptoms and signs involving the circulatory and respiratory systems: Secondary | ICD-10-CM | POA: Insufficient documentation

## 2013-04-11 DIAGNOSIS — IMO0002 Reserved for concepts with insufficient information to code with codable children: Secondary | ICD-10-CM | POA: Insufficient documentation

## 2013-04-11 MED ORDER — OXYCODONE-ACETAMINOPHEN 5-325 MG PO TABS: 1.0000 | ORAL_TABLET | Freq: Four times a day (QID) | ORAL | Status: DC | PRN

## 2013-04-11 MED ORDER — IBUPROFEN 800 MG PO TABS: 800.0000 mg | ORAL_TABLET | Freq: Three times a day (TID) | ORAL | Status: DC

## 2013-04-11 MED ORDER — OXYCODONE-ACETAMINOPHEN 5-325 MG PO TABS
1.0000 | ORAL_TABLET | Freq: Once | ORAL | Status: AC
  Administered 2013-04-11: 1 via ORAL
  Filled 2013-04-11: qty 1

## 2013-04-11 MED ORDER — DIAZEPAM 5 MG PO TABS: 5.0000 mg | ORAL_TABLET | Freq: Two times a day (BID) | ORAL | Status: DC

## 2013-04-11 NOTE — ED Notes (Signed)
Patient transported to X-ray 
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
## 2013-04-11 NOTE — Discharge Instructions (Signed)
As discussed, with your new injury it is important to have plenty of rest, take all medication as directed, and he do not hesitate to return here for concerning changes in your condition if they occur.

## 2013-04-18 ENCOUNTER — Encounter (HOSPITAL_BASED_OUTPATIENT_CLINIC_OR_DEPARTMENT_OTHER): Payer: Self-pay | Admitting: Emergency Medicine

## 2013-04-18 ENCOUNTER — Emergency Department (HOSPITAL_BASED_OUTPATIENT_CLINIC_OR_DEPARTMENT_OTHER): Payer: BC Managed Care – PPO

## 2013-04-18 ENCOUNTER — Emergency Department (HOSPITAL_BASED_OUTPATIENT_CLINIC_OR_DEPARTMENT_OTHER)
Admission: EM | Admit: 2013-04-18 | Discharge: 2013-04-18 | Disposition: A | Discharge status: Home / Self Care | Payer: BC Managed Care – PPO | Attending: Emergency Medicine | Admitting: Emergency Medicine

## 2013-04-18 DIAGNOSIS — Z791 Long term (current) use of non-steroidal anti-inflammatories (NSAID): Secondary | ICD-10-CM | POA: Insufficient documentation

## 2013-04-18 DIAGNOSIS — M549 Dorsalgia, unspecified: Secondary | ICD-10-CM | POA: Insufficient documentation

## 2013-04-18 DIAGNOSIS — Z79899 Other long term (current) drug therapy: Secondary | ICD-10-CM | POA: Insufficient documentation

## 2013-04-18 DIAGNOSIS — K219 Gastro-esophageal reflux disease without esophagitis: Secondary | ICD-10-CM | POA: Insufficient documentation

## 2013-04-18 DIAGNOSIS — F17200 Nicotine dependence, unspecified, uncomplicated: Secondary | ICD-10-CM | POA: Insufficient documentation

## 2013-04-18 MED ORDER — IBUPROFEN 600 MG PO TABS: 600.0000 mg | ORAL_TABLET | Freq: Four times a day (QID) | ORAL | Status: DC | PRN

## 2013-04-18 MED ORDER — HYDROMORPHONE HCL PF 2 MG/ML IJ SOLN
2.0000 mg | Freq: Once | INTRAMUSCULAR | Status: AC
  Administered 2013-04-18: 2 mg via INTRAMUSCULAR
  Filled 2013-04-18: qty 1

## 2013-04-18 MED ORDER — OXYCODONE-ACETAMINOPHEN 10-325 MG PO TABS: 1.0000 | ORAL_TABLET | Freq: Four times a day (QID) | ORAL | Status: DC | PRN

## 2013-04-18 NOTE — ED Provider Notes (Signed)
CSN: [id number]     Arrival date & time 04/18/13  1250 History   First MD Initiated Contact with Patient 04/18/13 1335     Chief Complaint  Patient presents with  . Back Pain     (Consider location/radiation/quality/duration/timing/severity/associated sxs/prior Treatment) Patient is a 58 y.o. male presenting with back pain. The history is provided by the patient. No language interpreter was used.  Back Pain Pain location: T&L junction. Quality:  Aching Radiates to:  Does not radiate Pain severity:  Severe Pain is:  Same all the time Onset quality:  Sudden Duration:  9 days Timing:  Constant Chronicity:  New Context: falling   Context comment:  Pt fell backwards and fell onto a board Relieved by:  Nothing Worsened by:  Bending Ineffective treatments:  None tried Associated symptoms: no abdominal pain, no abdominal swelling, no bladder incontinence, no bowel incontinence, no chest pain, no dysuria, no fever, no headaches, no leg pain, no numbness, no paresthesias, no perianal numbness and no weakness     Past Medical History  Diagnosis Date  . Acid reflux    Past Surgical History  Procedure Laterality Date  . Upper gastrointestinal endoscopy    . Back surgery    . Anterior cruciate ligament repair  1996    x 2 left knee  . Appendectomy     No family history on file. History  Substance Use Topics  . Smoking status: Current Every Day Smoker -- 2.00 packs/day for 40 years    Types: Cigarettes  . Smokeless tobacco: Not on file  . Alcohol Use: 2.4 oz/week    4 Cans of beer per week     Comment: " a couple every day"    Review of Systems  Constitutional: Negative for fever, activity change, appetite change and fatigue.  HENT: Negative for congestion, facial swelling, rhinorrhea and trouble swallowing.   Eyes: Negative for photophobia and pain.  Respiratory: Negative for cough, chest tightness and shortness of breath.   Cardiovascular: Negative for chest pain and leg  swelling.  Gastrointestinal: Negative for nausea, vomiting, abdominal pain, diarrhea, constipation and bowel incontinence.  Endocrine: Negative for polydipsia and polyuria.  Genitourinary: Negative for bladder incontinence, dysuria, urgency, decreased urine volume and difficulty urinating.  Musculoskeletal: Positive for back pain. Negative for gait problem.  Skin: Negative for color change, rash and wound.  Allergic/Immunologic: Negative for immunocompromised state.  Neurological: Negative for dizziness, facial asymmetry, speech difficulty, weakness, numbness, headaches and paresthesias.  Psychiatric/Behavioral: Negative for confusion, decreased concentration and agitation.      Allergies  Codeine  Home Medications   Current Outpatient Rx  Name  Route  Sig  Dispense  Refill  . diazepam (VALIUM) 5 MG tablet   Oral   Take 1 tablet (5 mg total) by mouth 2 (two) times daily.   10 tablet   0   . ibuprofen (ADVIL,MOTRIN) 600 MG tablet   Oral   Take 1 tablet (600 mg total) by mouth every 6 (six) hours as needed.   30 tablet   0   . ibuprofen (ADVIL,MOTRIN) 800 MG tablet   Oral   Take 1 tablet (800 mg total) by mouth 3 (three) times daily.   12 tablet   0   . magnesium oxide (MAG-OX) 400 MG tablet   Oral   Take 400 mg by mouth daily.           . naphazoline (CLEAR EYES) 0.012 % ophthalmic solution   Both Eyes  Place 1 drop into both eyes daily.           Marland Kitchen oxyCODONE-acetaminophen (PERCOCET) 10-325 MG per tablet   Oral   Take 1 tablet by mouth every 6 (six) hours as needed for pain.   20 tablet   0   . oxyCODONE-acetaminophen (PERCOCET/ROXICET) 5-325 MG per tablet   Oral   Take 1 tablet by mouth every 6 (six) hours as needed for severe pain.   20 tablet   0   . Potassium (POTASSIMIN PO)   Oral   Take 1 tablet by mouth daily.           Marland Kitchen zolpidem (AMBIEN) 5 MG tablet   Oral   Take 5 mg by mouth at bedtime.           BP 132/80  Pulse 72  Temp(Src)  98.8 F (37.1 C) (Oral)  Resp 20  SpO2 9% Physical Exam  Constitutional: He is oriented to person, place, and time. He appears well-developed and well-nourished. No distress.  HENT:  Head: Normocephalic and atraumatic.  Mouth/Throat: No oropharyngeal exudate.  Eyes: Pupils are equal, round, and reactive to light.  Neck: Normal range of motion. Neck supple.  Cardiovascular: Normal rate, regular rhythm and normal heart sounds.  Exam reveals no gallop and no friction rub.   No murmur heard. Pulmonary/Chest: Effort normal and breath sounds normal. No respiratory distress. He has no wheezes. He has no rales.  Abdominal: Soft. Bowel sounds are normal. He exhibits no distension and no mass. There is no tenderness. There is no rebound and no guarding.  Musculoskeletal: Normal range of motion. He exhibits no edema.       Thoracic back: He exhibits tenderness and bony tenderness.       Back:  Neurological: He is alert and oriented to person, place, and time.  Skin: Skin is warm and dry.  Psychiatric: He has a normal mood and affect.    ED Course  Procedures (including critical care time) Labs Review Labs Reviewed - No data to display Imaging Review Ct Thoracic Spine Wo Contrast  04/18/2013   CLINICAL DATA:  Fall.  EXAM: CT THORACIC SPINE WITHOUT CONTRAST  TECHNIQUE: Multidetector CT imaging of the thoracic spine was performed without intravenous contrast administration. Multiplanar CT image reconstructions were also generated.  COMPARISON:  DG CHEST 2 VIEW dated 04/11/2013  FINDINGS: Mild lordosis is present. Diffuse severe multilevel degenerative disease present with multilevel disc space loss and degenerative endplate osteophyte formation. No paraspinal lesions are noted. No acute bony or joint abnormality identified. No evidence fracture.  IMPRESSION: Diffuse degenerative change.  No acute abnormality.  No fracture.   Electronically Signed   By: Maisie Fus  Register   On: 04/18/2013 15:37   Ct  Lumbar Spine Wo Contrast  04/18/2013   CLINICAL DATA:  Fall.  EXAM: CT LUMBAR SPINE WITHOUT CONTRAST  TECHNIQUE: Multidetector CT imaging of the lumbar spine was performed without intravenous contrast administration. Multiplanar CT image reconstructions were also generated.  COMPARISON:  DG LUMBAR SPINE COMPLETE dated 04/11/2013  FINDINGS: Aortoiliac atherosclerotic vascular calcification is present. Paraspinal soft tissues are otherwise unremarkable. Diffuse multilevel disc degeneration with multilevel endplate osteophyte formation in annular bulge present. No evidence fracture or malalignment. No evidence of dislocation.  IMPRESSION: Diffuse severe degenerative change.  No acute abnormality.   Electronically Signed   By: Maisie Fus  Register   On: 04/18/2013 15:29     EKG Interpretation None      MDM  Final diagnoses:  Back pain    Pt is a 58 y.o. male with Pmhx as above who presents with continued back pain after he fall backwards, landing on a board about 9 days ago. XRs at the time negative. Pt denies numbness, weakness, fever, saddle anesthesia, numbness, weakness. On PE, VSS, pt in NAD.  He has horizontal  contusion/abrasion across back at T/L junction. Given pt not improved CT ordered to r/o occult fracture.  No acute fx seen, but pt does have severe, diffuse degenerative changes. Doubt cauda equina/cord compression. Will refer to [facility]. Pt instructed on use of NSAIDs, narcotics for breakthrough pain.    '    Makela Niehoff E Talyssa Gibas, MD 04/19/13 2043

## 2013-04-18 NOTE — ED Notes (Signed)
Back pain x2 weeks

## 2013-04-18 NOTE — Discharge Instructions (Signed)
Back Pain, Adult °Low back pain is very common. About 1 in 5 people have back pain. The cause of low back pain is rarely dangerous. The pain often gets better over time. About half of people with a sudden onset of back pain feel better in just 2 weeks. About 8 in 10 people feel better by 6 weeks.  °CAUSES °Some common causes of back pain include: °· Strain of the muscles or ligaments supporting the spine. °· Wear and tear (degeneration) of the spinal discs. °· Arthritis. °· Direct injury to the back. °DIAGNOSIS °Most of the time, the direct cause of low back pain is not known. However, back pain can be treated effectively even when the exact cause of the pain is unknown. Answering your caregiver's questions about your overall health and symptoms is one of the most accurate ways to make sure the cause of your pain is not dangerous. If your caregiver needs more information, he or she may order lab work or imaging tests (X-rays or MRIs). However, even if imaging tests show changes in your back, this usually does not require surgery. °HOME CARE INSTRUCTIONS °For many people, back pain returns. Since low back pain is rarely dangerous, it is often a condition that people can learn to manage on their own.  °· Remain active. It is stressful on the back to sit or stand in one place. Do not sit, drive, or stand in one place for more than 30 minutes at a time. Take short walks on level surfaces as soon as pain allows. Try to increase the length of time you walk each day. °· Do not stay in bed. Resting more than 1 or 2 days can delay your recovery. °· Do not avoid exercise or work. Your body is made to move. It is not dangerous to be active, even though your back may hurt. Your back will likely heal faster if you return to being active before your pain is gone. °· Pay attention to your body when you  bend and lift. Many people have less discomfort when lifting if they bend their knees, keep the load close to their bodies, and  avoid twisting. Often, the most comfortable positions are those that put less stress on your recovering back. °· Find a comfortable position to sleep. Use a firm mattress and lie on your side with your knees slightly bent. If you lie on your back, put a pillow under your knees. °· Only take over-the-counter or prescription medicines as directed by your caregiver. Over-the-counter medicines to reduce pain and inflammation are often the most helpful. Your caregiver may prescribe muscle relaxant drugs. These medicines help dull your pain so you can more quickly return to your normal activities and healthy exercise. °· Put ice on the injured area. °· Put ice in a plastic bag. °· Place a towel between your skin and the bag. °· Leave the ice on for 15-20 minutes, 03-04 times a day for the first 2 to 3 days. After that, ice and heat may be alternated to reduce pain and spasms. °· Ask your caregiver about trying back exercises and gentle massage. This may be of some benefit. °· Avoid feeling anxious or stressed. Stress increases muscle tension and can worsen back pain. It is important to recognize when you are anxious or stressed and learn ways to manage it. Exercise is a great option. °SEEK MEDICAL CARE IF: °· You have pain that is not relieved with rest or medicine. °· You have pain that does not improve in 1 week. °· You have new symptoms. °· You are generally not feeling well. °SEEK   IMMEDIATE MEDICAL CARE IF:  °· You have pain that radiates from your back into your legs. °· You develop new bowel or bladder control problems. °· You have unusual weakness or numbness in your arms or legs. °· You develop nausea or vomiting. °· You develop abdominal pain. °· You feel faint. °Document Released: 01/24/2005 Document Revised: 07/26/2011 Document Reviewed: 06/14/2010 °ExitCare® Patient Information ©2014 [employer]. ° ° °Emergency Department Resource Guide °1) Find a Doctor and Pay Out of Pocket °Although you won't have to find  out who is covered by your insurance plan, it is a good idea to ask around and get recommendations. You will then need to call the office and see if the doctor you have chosen will accept you as a new patient and what types of options they offer for patients who are self-pay. Some doctors offer discounts or will set up payment plans for their patients who do not have insurance, but you will need to ask so you aren't surprised when you get to your appointment. ° °2) Contact Your Local Health Department °Not all health departments have doctors that can see patients for sick visits, but many do, so it is worth a call to see if yours does. If you don't know where your local health department is, you can check in your phone book. The CDC also has a tool to help you locate your state's health department, and many state websites also have listings of all of their local health departments. ° °3) Find a Walk-in Clinic °If your illness is not likely to be very severe or complicated, you may want to try a walk in clinic. These are popping up all over the country in pharmacies, drugstores, and shopping centers. They're usually staffed by nurse practitioners or physician assistants that have been trained to treat common illnesses and complaints. They're usually fairly quick and inexpensive. However, if you have serious medical issues or chronic medical problems, these are probably not your best option. ° °No Primary Care Doctor: °- Call [facility] at  832-8000 - they can help you locate a primary care doctor that  accepts your insurance, provides certain services, etc. °- Physician Referral Service- 1-800-533-3463 ° °Chronic Pain Problems: °Organization         Address  Phone   Notes  °[facility]  (336) 297-2271 Patients need to be referred by their primary care doctor.  ° °Medication Assistance: °Organization         Address  Phone   Notes  °[place] [facility] [address] °[place], [place] [address] (336) 641-8030 --Must be a resident of [place] °-- Must have NO insurance coverage whatsoever (no Medicaid/ Medicare, etc.) °-- The pt. MUST have a primary care doctor that directs their care regularly and follows them in the community °  °[facility]  (866) 331-1348   °[employer]  (888) 892-1162   ° °Agencies that provide inexpensive medical care: °Organization         Address  Phone   Notes  °[facility]  (336) 832-8035   °[facility]    (336) 832-7272   °[facility] [address] °[place], [place] [address] (336) 832-4777   °[facility] [address], °[place] (336) 271-4999   °[facility]    (336) 373-0678   °[facility]    (336) 272-1050   °[facility] °   [address], [place] Phone:  (336) 832-4444, Fax:  (336) 832-4440 Hours of Operation:  9 am - 6 pm, M-F.  Also accepts Medicaid/Medicare and self-pay.  °[facility] ° [address], [place] Phone: (336) 832-3150, Fax: (336) [phone]. Hours of Operation:  8:30 am - 5:30 pm, M-F.  Also accepts Medicaid and self-pay.  °[facility] [place] [address], [place] Phone: (336) 878-6027   °[facility] [address], [place], [place] (336)723-1848, Ext. 123 Mondays & Thursdays: 7-9 AM.  First 15 patients are seen on a first come, first serve basis. °  ° °Medicaid-accepting [place] Providers: ° °Organization         Address  Phone   Notes  °[facility] [address], [place] (336) 641-2100 Also accepts self-pay patients.  °[facility] [address], [place] ° (336) 856-9996   °[facility] [address], [place] (336) 288-8857   °[facility] [address] [place] [address], [place] (336) 299-7000   °Veita Bland  [address], [place]  ° (336) 373-1557 Only accepts [place] Access Medicaid patients after they have their name applied to their card.  ° °Self-Pay (no insurance) in [place]: ° °Organization         Address  Phone   Notes  °Sickle Cell Patients, [facility] [address], [place] (336) 832-1970   °[facility] [address], [place] (336) 832-4400   °[facility] ° [address], [place] (336) 992-4800   °[facility]/Dr. Osei-Bonsu ° [address] [place] [address], [place] or [address], [place] (336) 841-8500 Phone number for both [place] and [place] locations is the same.  °[facility] [address], [place] (336) 299-0000   °[facility] [address] [place] [address], [place] or [address] (336) 852-7530 °(336) 878-2260   °[facility] [address], [place] (336) 350-1642, phone; (336) 294-5005, fax Sees patients 1st and 3rd Saturday of every month.  Must not qualify for public or private insurance (i.e. Medicaid, Medicare, [place] Health Choice, Veterans' Benefits) • Household income should be no more than 200% of the poverty level •The clinic cannot treat you if you are pregnant or think you are pregnant • Sexually transmitted diseases are not treated at the clinic.  ° ° °Dental Care: °Organization         Address  Phone  Notes  °[facility] [address], [place] (336) 641-6152 Accepts children up to age 21 who are enrolled in Medicaid or [place] Health Choice; pregnant women with a Medicaid card; and children who have applied for Medicaid or [place] Health Choice, but were declined, whose parents can pay a reduced fee at time of service.  °[facility] [place]  [address], [place] (336) 641-7733 Accepts children up to age 21  who are enrolled in Medicaid or [place] Health Choice; pregnant women with a Medicaid card; and children who have applied for Medicaid or [place] Health Choice, but were declined, whose parents can pay a reduced fee at time of service.  °[facility] ° [address], [place] (336) 641-4533   Patients are seen by appointment only. Walk-ins are not accepted. [facility] will see patients 18 years of age and older. °Monday - Tuesday (8am-5pm) °Most Wednesdays (8:30-5pm) °$30 per visit, cash only  °[facility] ° [address], [place] (336) 641-4533 Patients are seen by appointment only. Walk-ins are not accepted. [facility] will see patients 18 years of age and older. °One Wednesday Evening (Monthly: Volunteer Based).  $30 per visit, cash only  °[facility]  (919) 537-3737 for adults; Children under age 4, call Graduate Pediatric Dentistry at ([phone]) [phone]. Children aged 4-14, please call (919) [phone] to request a pediatric application. ° Dental services are provided in all areas of dental care including fillings, crowns and bridges, complete and partial dentures, implants, gum treatment, root canals, and extractions. Preventive care is also provided. Treatment is provided to both adults and children. °Patients are selected via a lottery and there is often a waiting list. °  °[facility] [address], °[place] ° (336) 763-8833 [email or web address] °  °[facility] [address], [place], [place] (336)723-1848, Ext. 123 Second and Fourth Thursday of each month, opens at 6:30 AM; Clinic ends at 9 AM.  Patients are seen on a first-come first-served basis, and a limited number are seen during each clinic.  ° °[facility] ° [address], [place], [place] (336) 723-7904   Eligibility Requirements °You must have lived in [place], [place], or [place] counties for at least the last three months. °   You cannot be eligible for state or federal sponsored healthcare insurance, including Veterans Administration, Medicaid, or Medicare. °  You generally cannot be eligible for healthcare insurance through your employer.  °  How to apply: °Eligibility screenings are held every Tuesday and Wednesday afternoon from 1:00 pm until 4:00 pm. You do not need an appointment for the interview!  °[facility] [address], [place], [place] 336-631-2330   °[facility]  336-342-8273   °[facility]  336-703-3100   °[facility]  336-570-6415   ° °Behavioral Health Resources in the Community: °Intensive Outpatient Programs °Organization         Address  Phone  Notes  °[facility] [address], [place], [place] 336-878-6098   °[facility] [address], [place], [place] 336-832-9800   °[facility] [address], [place], [place] ° 336-882-2125   °[facility] [address],  °[place], [place] 1-800-853-5163 or 336-641-4981   °Substance Abuse Resources °Organization         Address  Phone  Notes  °[facility]  336-882-2125   °[facility]  336-784-9470   °[facility]  336-285-9073   °[facility]  336-845-3988   °Residential & Outpatient Substance Abuse Program  1-800-659-3381   °Psychological Services °Organization         Address  Phone  Notes  °[facility]  336- 832-9600   °[facility]  336- 378-7881   °[facility] [address], [place] 1-800-853-5163 or 336-641-4981   ° °Mobile Crisis Teams °Organization         Address  Phone  Notes  °[facility], Mobile Crisis Care Unit  1-877-626-1772   °Assertive °[facility] ° [address] [place], [place] 336-834-9664   °Sharon DeEsch [address] °  [place] 336-554-5454   ° °Self-Help/Support  Groups °Organization         Address  Phone             Notes  °[facility] [place] - variety of support groups  336- 373-1402 Call for more information  °Narcotics Anonymous (NA), [facility] [address], °[place]  2 meetings at this location  ° °Residential Treatment Programs °Organization         Address  Phone  Notes  °[facility] [address],    °[place]  1-866-801-8205   °[facility] ° [address], [place], [place] 704-293-8524   °[facility] [address], [place] 336-845-3988 Admissions: 8am-3pm M-F  °[facility] [address]    °[place], [place] 336-841-1104   °[facility] [address], [place], [place] 336-379-7146   °[facility] [address]  °[place], [place] 336-285-9073   °[facility] - Intensive Outpatient [address], , [place] 336-852-3033   °[facility] [address]  °[place], [place] 1-877-615-2722 or 336-784-9470   °[facility] [address] [place], [place] 336-227-7417 Accepts Medicaid  °[facility] [address]  ° [place] 1-800-659-3381 Substance Abuse/Addiction Treatment  ° °[place] Behavioral Health Resources °Organization         Address  Phone  Notes  °[facility]  (888) 581-9988   °Julie Brannon, PhD [address] [place], [place]   (336) 349-5553 or (336) 951-0000   °[facility]   [address] °[place], [place] (336) 349-4454   °[facility] [address], [place], [place] (336) 342-8316 Insurance/Medicaid/sponsorship through [facility]  °[facility] [address]                                    [place],  (336) 342-8316 Therapy/tele-psych/case  °[facility] [address]  ° [place],  (336) 349-2233    °Dr. Arfeen  (336) 349-4544   °[facility] [place]  [employer] [place] [facility] 1) [address], [place] °2) [address], [place] °3)  [address], [place] (336) 349-3220 °(336) 342-7768 ° °(336) 342-8140   °[place] Child Abuse [facility]line (336) 342-1394 or (336) 342-3537 (After Hours)    ° ° ° °

## 2013-05-25 ENCOUNTER — Inpatient Hospital Stay (HOSPITAL_BASED_OUTPATIENT_CLINIC_OR_DEPARTMENT_OTHER)
Admission: EM | Admit: 2013-05-25 | Discharge: 2013-05-27 | DRG: 378 | Disposition: A | Discharge status: Home / Self Care | Payer: BC Managed Care – PPO | Attending: Internal Medicine | Admitting: Internal Medicine

## 2013-05-25 ENCOUNTER — Emergency Department (HOSPITAL_BASED_OUTPATIENT_CLINIC_OR_DEPARTMENT_OTHER): Payer: BC Managed Care – PPO

## 2013-05-25 ENCOUNTER — Encounter (HOSPITAL_BASED_OUTPATIENT_CLINIC_OR_DEPARTMENT_OTHER): Payer: Self-pay | Admitting: Emergency Medicine

## 2013-05-25 DIAGNOSIS — T3995XA Adverse effect of unspecified nonopioid analgesic, antipyretic and antirheumatic, initial encounter: Secondary | ICD-10-CM | POA: Diagnosis present

## 2013-05-25 DIAGNOSIS — K7030 Alcoholic cirrhosis of liver without ascites: Secondary | ICD-10-CM

## 2013-05-25 DIAGNOSIS — F17200 Nicotine dependence, unspecified, uncomplicated: Secondary | ICD-10-CM | POA: Diagnosis present

## 2013-05-25 DIAGNOSIS — D500 Iron deficiency anemia secondary to blood loss (chronic): Secondary | ICD-10-CM | POA: Diagnosis present

## 2013-05-25 DIAGNOSIS — K449 Diaphragmatic hernia without obstruction or gangrene: Secondary | ICD-10-CM | POA: Diagnosis present

## 2013-05-25 DIAGNOSIS — R188 Other ascites: Secondary | ICD-10-CM | POA: Diagnosis present

## 2013-05-25 DIAGNOSIS — F1020 Alcohol dependence, uncomplicated: Secondary | ICD-10-CM | POA: Diagnosis present

## 2013-05-25 DIAGNOSIS — B1920 Unspecified viral hepatitis C without hepatic coma: Secondary | ICD-10-CM | POA: Diagnosis present

## 2013-05-25 DIAGNOSIS — S2249XA Multiple fractures of ribs, unspecified side, initial encounter for closed fracture: Secondary | ICD-10-CM | POA: Diagnosis present

## 2013-05-25 DIAGNOSIS — K920 Hematemesis: Principal | ICD-10-CM | POA: Diagnosis present

## 2013-05-25 DIAGNOSIS — F1010 Alcohol abuse, uncomplicated: Secondary | ICD-10-CM

## 2013-05-25 DIAGNOSIS — K922 Gastrointestinal hemorrhage, unspecified: Secondary | ICD-10-CM

## 2013-05-25 DIAGNOSIS — K319 Disease of stomach and duodenum, unspecified: Secondary | ICD-10-CM | POA: Diagnosis present

## 2013-05-25 DIAGNOSIS — Z23 Encounter for immunization: Secondary | ICD-10-CM

## 2013-05-25 DIAGNOSIS — K2270 Barrett's esophagus without dysplasia: Secondary | ICD-10-CM | POA: Diagnosis present

## 2013-05-25 DIAGNOSIS — Z6830 Body mass index (BMI) 30.0-30.9, adult: Secondary | ICD-10-CM

## 2013-05-25 DIAGNOSIS — K766 Portal hypertension: Secondary | ICD-10-CM | POA: Diagnosis present

## 2013-05-25 DIAGNOSIS — K219 Gastro-esophageal reflux disease without esophagitis: Secondary | ICD-10-CM | POA: Diagnosis present

## 2013-05-25 DIAGNOSIS — D62 Acute posthemorrhagic anemia: Secondary | ICD-10-CM

## 2013-05-25 DIAGNOSIS — Z885 Allergy status to narcotic agent status: Secondary | ICD-10-CM

## 2013-05-25 DIAGNOSIS — D6959 Other secondary thrombocytopenia: Secondary | ICD-10-CM | POA: Diagnosis present

## 2013-05-25 LAB — URINALYSIS, ROUTINE W REFLEX MICROSCOPIC
GLUCOSE, UA: NEGATIVE mg/dL
HGB URINE DIPSTICK: NEGATIVE
KETONES UR: NEGATIVE mg/dL
Nitrite: NEGATIVE
PROTEIN: NEGATIVE mg/dL
Specific Gravity, Urine: 1.028 (ref 1.005–1.030)
UROBILINOGEN UA: 2 mg/dL — AB (ref 0.0–1.0)
pH: 6 (ref 5.0–8.0)

## 2013-05-25 LAB — PROTIME-INR
INR: 1.31 (ref 0.00–1.49)
Prothrombin Time: 16 seconds — ABNORMAL HIGH (ref 11.6–15.2)

## 2013-05-25 LAB — COMPREHENSIVE METABOLIC PANEL
ALBUMIN: 2.9 g/dL — AB (ref 3.5–5.2)
ALT: 64 U/L — ABNORMAL HIGH (ref 0–53)
AST: 90 U/L — ABNORMAL HIGH (ref 0–37)
Alkaline Phosphatase: 94 U/L (ref 39–117)
BILIRUBIN TOTAL: 1.5 mg/dL — AB (ref 0.3–1.2)
BUN: 33 mg/dL — AB (ref 6–23)
CALCIUM: 9 mg/dL (ref 8.4–10.5)
CO2: 24 mEq/L (ref 19–32)
Chloride: 110 mEq/L (ref 96–112)
Creatinine, Ser: 0.9 mg/dL (ref 0.50–1.35)
GFR calc Af Amer: >90 mL/min (ref >90)
GFR calc non Af Amer: >90 mL/min (ref >90)
Glucose, Bld: 105 mg/dL — ABNORMAL HIGH (ref 70–99)
Potassium: 4.3 mEq/L (ref 3.7–5.3)
Sodium: 145 mEq/L (ref 137–147)
TOTAL PROTEIN: 6.7 g/dL (ref 6.0–8.3)

## 2013-05-25 LAB — CBC
HEMATOCRIT: 33.3 % — AB (ref 39.0–52.0)
Hemoglobin: 11.6 g/dL — ABNORMAL LOW (ref 13.0–17.0)
MCH: 34.3 pg — ABNORMAL HIGH (ref 26.0–34.0)
MCHC: 34.8 g/dL (ref 30.0–36.0)
MCV: 98.5 fL (ref 78.0–100.0)
PLATELETS: DECREASED 10*3/uL (ref 150–400)
RBC: 3.38 MIL/uL — ABNORMAL LOW (ref 4.22–5.81)
RDW: 14.5 % (ref 11.5–15.5)
WBC: 10.5 10*3/uL (ref 4.0–10.5)

## 2013-05-25 LAB — URINE MICROSCOPIC-ADD ON

## 2013-05-25 LAB — OCCULT BLOOD X 1 CARD TO LAB, STOOL: FECAL OCCULT BLD: POSITIVE — AB

## 2013-05-25 MED ORDER — PNEUMOCOCCAL VAC POLYVALENT 25 MCG/0.5ML IJ INJ
0.5000 mL | INJECTION | INTRAMUSCULAR | Status: AC
  Administered 2013-05-26: 0.5 mL via INTRAMUSCULAR
  Filled 2013-05-25: qty 0.5

## 2013-05-25 MED ORDER — VITAMIN B-1 100 MG PO TABS
100.0000 mg | ORAL_TABLET | Freq: Every day | ORAL | Status: DC
  Administered 2013-05-25 – 2013-05-27 (×3): 100 mg via ORAL
  Filled 2013-05-25 (×3): qty 1

## 2013-05-25 MED ORDER — OXYCODONE HCL 5 MG PO TABS
5.0000 mg | ORAL_TABLET | ORAL | Status: DC | PRN
  Administered 2013-05-26 – 2013-05-27 (×5): 5 mg via ORAL
  Filled 2013-05-25 (×5): qty 1

## 2013-05-25 MED ORDER — NAPHAZOLINE HCL 0.012 % OP SOLN: 1.0000 [drp] | Freq: Every day | OPHTHALMIC | Status: DC

## 2013-05-25 MED ORDER — FOLIC ACID 1 MG PO TABS
1.0000 mg | ORAL_TABLET | Freq: Every day | ORAL | Status: DC
  Administered 2013-05-25 – 2013-05-27 (×3): 1 mg via ORAL
  Filled 2013-05-25 (×3): qty 1

## 2013-05-25 MED ORDER — NICOTINE 21 MG/24HR TD PT24
21.0000 mg | MEDICATED_PATCH | Freq: Every day | TRANSDERMAL | Status: DC
  Administered 2013-05-25 – 2013-05-27 (×3): 21 mg via TRANSDERMAL
  Filled 2013-05-25 (×4): qty 1

## 2013-05-25 MED ORDER — ZOLPIDEM TARTRATE 5 MG PO TABS
5.0000 mg | ORAL_TABLET | Freq: Every day | ORAL | Status: DC
  Administered 2013-05-25: 5 mg via ORAL
  Filled 2013-05-25: qty 1

## 2013-05-25 MED ORDER — SODIUM CHLORIDE 0.9 % IV SOLN
80.0000 mg | Freq: Once | INTRAVENOUS | Status: AC
  Administered 2013-05-25: 80 mg via INTRAVENOUS

## 2013-05-25 MED ORDER — ONDANSETRON HCL 4 MG PO TABS: 4.0000 mg | ORAL_TABLET | Freq: Four times a day (QID) | ORAL | Status: DC | PRN

## 2013-05-25 MED ORDER — SODIUM CHLORIDE 0.9 % IV SOLN
80.0000 mg | Freq: Once | INTRAVENOUS | Status: AC
  Administered 2013-05-25: 80 mg via INTRAVENOUS
  Filled 2013-05-25 (×2): qty 80

## 2013-05-25 MED ORDER — IOHEXOL 300 MG/ML  SOLN: 50.0000 mL | Freq: Once | INTRAMUSCULAR | Status: AC | PRN

## 2013-05-25 MED ORDER — LORAZEPAM 2 MG/ML IJ SOLN
0.0000 mg | Freq: Four times a day (QID) | INTRAMUSCULAR | Status: DC
  Filled 2013-05-25: qty 2

## 2013-05-25 MED ORDER — ACETAMINOPHEN 325 MG PO TABS: 650.0000 mg | ORAL_TABLET | Freq: Four times a day (QID) | ORAL | Status: DC | PRN

## 2013-05-25 MED ORDER — LORAZEPAM 2 MG/ML IJ SOLN: 1.0000 mg | Freq: Four times a day (QID) | INTRAMUSCULAR | Status: DC | PRN

## 2013-05-25 MED ORDER — LORAZEPAM 1 MG PO TABS: 1.0000 mg | ORAL_TABLET | Freq: Four times a day (QID) | ORAL | Status: DC | PRN

## 2013-05-25 MED ORDER — SODIUM CHLORIDE 0.9 % IV SOLN
Freq: Once | INTRAVENOUS | Status: AC
  Administered 2013-05-25: 16:00:00 via INTRAVENOUS

## 2013-05-25 MED ORDER — THIAMINE HCL 100 MG/ML IJ SOLN
100.0000 mg | Freq: Every day | INTRAMUSCULAR | Status: DC
  Filled 2013-05-25 (×3): qty 1

## 2013-05-25 MED ORDER — HYDROMORPHONE HCL PF 1 MG/ML IJ SOLN
0.5000 mg | INTRAMUSCULAR | Status: DC | PRN
  Administered 2013-05-26: 0.5 mg via INTRAVENOUS
  Administered 2013-05-26: 1 mg via INTRAVENOUS
  Filled 2013-05-25 (×2): qty 1

## 2013-05-25 MED ORDER — ONDANSETRON HCL 4 MG/2ML IJ SOLN
4.0000 mg | Freq: Once | INTRAMUSCULAR | Status: AC
  Administered 2013-05-25: 4 mg via INTRAVENOUS
  Filled 2013-05-25: qty 2

## 2013-05-25 MED ORDER — ACETAMINOPHEN 650 MG RE SUPP: 650.0000 mg | Freq: Four times a day (QID) | RECTAL | Status: DC | PRN

## 2013-05-25 MED ORDER — SODIUM CHLORIDE 0.9 % IV SOLN
INTRAVENOUS | Status: DC
  Administered 2013-05-25 – 2013-05-26 (×2): via INTRAVENOUS

## 2013-05-25 MED ORDER — MORPHINE SULFATE 4 MG/ML IJ SOLN
4.0000 mg | INTRAMUSCULAR | Status: DC | PRN
  Administered 2013-05-25 (×2): 4 mg via INTRAVENOUS
  Filled 2013-05-25 (×2): qty 1

## 2013-05-25 MED ORDER — ONDANSETRON HCL 4 MG/2ML IJ SOLN: 4.0000 mg | Freq: Four times a day (QID) | INTRAMUSCULAR | Status: DC | PRN

## 2013-05-25 MED ORDER — POLYVINYL ALCOHOL 1.4 % OP SOLN
1.0000 [drp] | Freq: Every day | OPHTHALMIC | Status: DC
Start: 2013-05-26 — End: 2013-05-27
  Administered 2013-05-26 – 2013-05-27 (×2): 1 [drp] via OPHTHALMIC
  Filled 2013-05-25: qty 15

## 2013-05-25 MED ORDER — DIAZEPAM 5 MG PO TABS
5.0000 mg | ORAL_TABLET | Freq: Two times a day (BID) | ORAL | Status: DC
  Administered 2013-05-25 – 2013-05-27 (×4): 5 mg via ORAL
  Filled 2013-05-25 (×4): qty 1

## 2013-05-25 MED ORDER — LORAZEPAM 2 MG/ML IJ SOLN: 0.0000 mg | Freq: Two times a day (BID) | INTRAMUSCULAR | Status: DC

## 2013-05-25 MED ORDER — PANTOPRAZOLE SODIUM 40 MG IV SOLR
INTRAVENOUS | Status: AC
  Filled 2013-05-25: qty 80

## 2013-05-25 MED ORDER — ADULT MULTIVITAMIN W/MINERALS CH
1.0000 | ORAL_TABLET | Freq: Every day | ORAL | Status: DC
  Administered 2013-05-25 – 2013-05-27 (×3): 1 via ORAL
  Filled 2013-05-25 (×3): qty 1

## 2013-05-25 MED ORDER — PANTOPRAZOLE SODIUM 40 MG IV SOLR: 40.0000 mg | Freq: Two times a day (BID) | INTRAVENOUS | Status: DC

## 2013-05-25 MED ORDER — IOHEXOL 300 MG/ML  SOLN
100.0000 mL | Freq: Once | INTRAMUSCULAR | Status: AC | PRN
Start: 2013-05-25 — End: 2013-05-25
  Administered 2013-05-25: 100 mL via INTRAVENOUS

## 2013-05-25 MED ORDER — SODIUM CHLORIDE 0.9 % IV SOLN
8.0000 mg/h | INTRAVENOUS | Status: DC
  Administered 2013-05-25 – 2013-05-27 (×4): 8 mg/h via INTRAVENOUS
  Filled 2013-05-25 (×11): qty 80

## 2013-05-25 NOTE — ED Provider Notes (Signed)
CSN: [id number]     Arrival date & time 05/25/13  1502 History  This chart was scribed for Rolland Porter, MD by Ardelia Mems, ED Scribe. This patient was seen in room MH02/MH02 and the patient's care was started at 3:50 PM.   Chief Complaint  Patient presents with  . Melena    The history is provided by the patient. No language interpreter was used.    HPI Comments: Tom Chang is a 58 y.o. male who presents to the Emergency Department complaining of 2 "huge" episodes of melena that occurred earlier today. He describes these stools as "half solid, half liquid and all black". He states that he had 2 associated episodes of "black-colored" hematemesis yesterday, and no further episodes today.  Ultimately, his SO shows, and has a cell phone picture showing maroon emesis filling the toilet. He further reports associated symptoms of reduced appetite, nausea and chills over the past 2 days. He denies any history of gastric ulcers or bleeding. He states that he had a upper endoscopy about 2 years ago showing Barrett's esophagus. He states that he has a history of appendicitis 20+ years ago, but denies any other surgical abdominal history. Pt is a current every day smoker of 40 years, and admits to drinking 3-6 beers a few nights of each week.  He also states that he has been having persistent right lower back pain that occasionally radiates to his RLQ abdomen over the past 7 weeks. He states that this pain onset after a fall from a standing height that occurred 7 weeks ago. He states that he landed with his back on a wooden board. He states that he was evaluated after the initial fall and had a CT showing only bruising of his back. He states that he took 600 mg of Ibuprofen once daily for the first 5 weeks after this fall. He also states that he was prescribed pain medication and Valium, and that he filled these prescriptions, but did not complete the the entire prescriptions of either. He states that he has  not been taking any pain medications over the pats 2 weeks.    Past Medical History  Diagnosis Date  . Acid reflux    Past Surgical History  Procedure Laterality Date  . Upper gastrointestinal endoscopy    . Back surgery    . Anterior cruciate ligament repair  1996    x 2 left knee  . Appendectomy     History reviewed. No pertinent family history. History  Substance Use Topics  . Smoking status: Current Every Day Smoker -- 2.00 packs/day for 40 years    Types: Cigarettes  . Smokeless tobacco: Never Used  . Alcohol Use: 2.4 oz/week    4 Cans of beer per week     Comment: 6-12 beers a day    Review of Systems  Constitutional: Positive for chills and appetite change (reduced). Negative for fever, diaphoresis and fatigue.  HENT: Negative for mouth sores, sore throat and trouble swallowing.   Eyes: Negative for visual disturbance.  Respiratory: Negative for cough, chest tightness, shortness of breath and wheezing.   Cardiovascular: Negative for chest pain.  Gastrointestinal: Positive for nausea, vomiting and blood in stool. Negative for abdominal pain, diarrhea and abdominal distention.  Endocrine: Negative for polydipsia, polyphagia and polyuria.  Genitourinary: Negative for dysuria, frequency and hematuria.  Musculoskeletal: Negative for gait problem.  Skin: Negative for color change, pallor and rash.  Neurological: Negative for dizziness, syncope, light-headedness and headaches.  Hematological: Does not bruise/bleed easily.  Psychiatric/Behavioral: Negative for behavioral problems and confusion.    Allergies  Codeine  Home Medications   Prior to Admission medications   Medication Sig Start Date End Date Taking? Authorizing Provider  diazepam (VALIUM) 5 MG tablet Take 1 tablet (5 mg total) by mouth 2 (two) times daily. 04/11/13   Gerhard Munchobert Lockwood, MD  ibuprofen (ADVIL,MOTRIN) 600 MG tablet Take 1 tablet (600 mg total) by mouth every 6 (six) hours as needed. 04/18/13    Shanna CiscoMegan E Docherty, MD  ibuprofen (ADVIL,MOTRIN) 800 MG tablet Take 1 tablet (800 mg total) by mouth 3 (three) times daily. 04/11/13   Gerhard Munchobert Lockwood, MD  magnesium oxide (MAG-OX) 400 MG tablet Take 400 mg by mouth daily.      Historical Provider, MD  naphazoline (CLEAR EYES) 0.012 % ophthalmic solution Place 1 drop into both eyes daily.      Historical Provider, MD  oxyCODONE-acetaminophen (PERCOCET) 10-325 MG per tablet Take 1 tablet by mouth every 6 (six) hours as needed for pain. 04/18/13   Shanna CiscoMegan E Docherty, MD  oxyCODONE-acetaminophen (PERCOCET/ROXICET) 5-325 MG per tablet Take 1 tablet by mouth every 6 (six) hours as needed for severe pain. 04/11/13   Gerhard Munchobert Lockwood, MD  Potassium (POTASSIMIN PO) Take 1 tablet by mouth daily.      Historical Provider, MD  zolpidem (AMBIEN) 5 MG tablet Take 5 mg by mouth at bedtime.     Historical Provider, MD   Triage Vitals: BP 121/95  Pulse 117  Temp(Src) 99 F (37.2 C) (Oral)  Resp 20  Ht 6\' 1"  (1.854 m)  Wt 230 lb (104.327 kg)  BMI 30.35 kg/m2  SpO2 100%  Physical Exam  Nursing note and vitals reviewed. Constitutional: He is oriented to person, place, and time. He appears well-developed and well-nourished. No distress.  HENT:  Head: Normocephalic.  Eyes: Conjunctivae are normal. Pupils are equal, round, and reactive to light. No scleral icterus.  ?Scleral icterus  Neck: Normal range of motion. Neck supple. No thyromegaly present.  Cardiovascular: Regular rhythm.  Exam reveals no gallop and no friction rub.   No murmur heard. Tachycardic  Pulmonary/Chest: Effort normal and breath sounds normal. No respiratory distress. He has no wheezes. He has no rales.  Abdominal: Soft. Bowel sounds are normal. He exhibits no distension. There is no tenderness. There is no rebound.  Non-tender across the anterior abdomen  Musculoskeletal: Normal range of motion.  Right lower rib tenderness. No swelling or ecchymosis.  Neurological: He is alert and oriented to  person, place, and time.  Skin: Skin is warm and dry. No rash noted.  Psychiatric: He has a normal mood and affect. His behavior is normal.    ED Course  Procedures (including critical care time)  DIAGNOSTIC STUDIES: Oxygen Saturation is 100% on RA, normal by my interpretation.    COORDINATION OF CARE: 3:58 PM- Discussed plan to perform a rectal exam. Pt advised of plan for treatment and pt agrees.  Labs Review Labs Reviewed  URINALYSIS, ROUTINE W REFLEX MICROSCOPIC - Abnormal; Notable for the following:    Color, Urine AMBER (*)    Bilirubin Urine SMALL (*)    Urobilinogen, UA 2.0 (*)    Leukocytes, UA SMALL (*)    All other components within normal limits  CBC - Abnormal; Notable for the following:    RBC 3.38 (*)    Hemoglobin 11.6 (*)    HCT 33.3 (*)    MCH 34.3 (*)  All other components within normal limits  COMPREHENSIVE METABOLIC PANEL - Abnormal; Notable for the following:    Glucose, Bld 105 (*)    BUN 33 (*)    Albumin 2.9 (*)    AST 90 (*)    ALT 64 (*)    Total Bilirubin 1.5 (*)    All other components within normal limits  URINE MICROSCOPIC-ADD ON - Abnormal; Notable for the following:    Bacteria, UA FEW (*)    All other components within normal limits  OCCULT BLOOD X 1 CARD TO LAB, STOOL - Abnormal; Notable for the following:    Fecal Occult Bld POSITIVE (*)    All other components within normal limits  PROTIME-INR  POC OCCULT BLOOD, ED   Imaging Review Ct Chest W Contrast  05/25/2013   CLINICAL DATA:  Fall 6 weeks ago. Right rib pain and abdominal pain.  EXAM: CT CHEST, ABDOMEN, AND PELVIS WITH CONTRAST  TECHNIQUE: Multidetector CT imaging of the chest, abdomen and pelvis was performed following the standard protocol during bolus administration of intravenous contrast.  CONTRAST:  100mL OMNIPAQUE IOHEXOL 300 MG/ML  SOLN  COMPARISON:  None.  FINDINGS: CT CHEST FINDINGS  Lungs are clear. No pulmonary nodules. No effusions. Insert Heart coronary artery  calcifications in the left anterior descending and likely in the right coronary artery. Aorta is normal caliber with scattered calcifications.  No mediastinal, hilar, or axillary adenopathy. Chest wall soft tissues are unremarkable.  Rib fractures are noted through the lateral posterior right tenth and eleventh ribs. No pneumothorax.  CT ABDOMEN AND PELVIS FINDINGS  Changes of cirrhosis with nodular contours of the liver. Associated dilated portal vein compatible with portal venous hypertension and splenomegaly. Spleen measures 18 cm in craniocaudal length. Associated small amount of pelvic, perihepatic and perisplenic ascites.  Aorta and iliac vessels are heavily calcified, non aneurysmal.  Stomach, large and small bowel are grossly unremarkable. Urinary bladder is decompressed, grossly unremarkable. No additional acute bony abnormality.  IMPRESSION: Right posterior lateral tenth and eleventh rib fractures which are mildly displaced. No associated pneumothorax or effusion.  Changes of cirrhosis with associated mild ascites and splenomegaly.  Coronary artery disease.   Electronically Signed   By: Charlett NoseKevin  Dover M.D.   On: 05/25/2013 18:00   Ct Abdomen Pelvis W Contrast  05/25/2013   CLINICAL DATA:  Fall 6 weeks ago. Right rib pain and abdominal pain.  EXAM: CT CHEST, ABDOMEN, AND PELVIS WITH CONTRAST  TECHNIQUE: Multidetector CT imaging of the chest, abdomen and pelvis was performed following the standard protocol during bolus administration of intravenous contrast.  CONTRAST:  100mL OMNIPAQUE IOHEXOL 300 MG/ML  SOLN  COMPARISON:  None.  FINDINGS: CT CHEST FINDINGS  Lungs are clear. No pulmonary nodules. No effusions. Insert Heart coronary artery calcifications in the left anterior descending and likely in the right coronary artery. Aorta is normal caliber with scattered calcifications.  No mediastinal, hilar, or axillary adenopathy. Chest wall soft tissues are unremarkable.  Rib fractures are noted through the  lateral posterior right tenth and eleventh ribs. No pneumothorax.  CT ABDOMEN AND PELVIS FINDINGS  Changes of cirrhosis with nodular contours of the liver. Associated dilated portal vein compatible with portal venous hypertension and splenomegaly. Spleen measures 18 cm in craniocaudal length. Associated small amount of pelvic, perihepatic and perisplenic ascites.  Aorta and iliac vessels are heavily calcified, non aneurysmal.  Stomach, large and small bowel are grossly unremarkable. Urinary bladder is decompressed, grossly unremarkable. No additional acute bony abnormality.  IMPRESSION: Right posterior lateral tenth and eleventh rib fractures which are mildly displaced. No associated pneumothorax or effusion.  Changes of cirrhosis with associated mild ascites and splenomegaly.  Coronary artery disease.   Electronically Signed   By: Charlett Nose M.D.   On: 05/25/2013 18:00     EKG Interpretation None      MDM   Final diagnoses:  Upper GI bleed  Multiple rib fractures    Adult male of heavy alcohol use. CT changes of cirrhosis. Melena, hematemesis. Recent NSAID use. Differential would include gastritis, gastric ulcer, duodenitis, duodenal ulcer, variceal hemorrhage.  He has felt weak in suggestion. Not frankly syncopal. Hemoglobin 11.6. White count pending, noted as "appears decreased".  Coags pending. 2 rib fractures explain his flank pain. Plan will be hospitalization for serial hemoglobins. Probable EGD.   I personally performed the services described in this documentation, which was scribed in my presence. The recorded information has been reviewed and is accurate.   Rolland Porter, MD 05/25/13 [phone]

## 2013-05-25 NOTE — ED Notes (Signed)
Pt reports 2 episodes black emesis yesterday. Today had black BM x 2- c/o right flank pain

## 2013-05-25 NOTE — Progress Notes (Signed)
Pt admitted to the unit. Pt is alert and oriented. Pt oriented to room, staff, and call bell. Bed in lowest position. Full assessment to Epic. Call bell with in reach. Told to call for assists. Will continue to monitor.  Tom Chang E Tom Chang  

## 2013-05-25 NOTE — ED Notes (Signed)
Patient was requesting to leave AMA-stated he wanted to f/u next week with a GI doctor. Dr. Fayrene FearingJames was notified and to bedside.

## 2013-05-25 NOTE — ED Notes (Signed)
Digital rectal exam performed.  Black stool noted in the perianal area.  Fecal occult card collected.

## 2013-05-25 NOTE — ED Notes (Signed)
Patient visually appears to feel unwell, skin is pale.  Conjunctiva is yellow.  Patient verbalizes having a large amount of money in his wallet.  Concerned about safe keeping.  I offered to have security lock it up for him, but he denied this offer.  Jamie B. Ashley RoyaltyMatthews, Charge RN was present in the room for this conversation.  Belongings placed in a bag and put on the stretcher to remain with patient.  He verbalized this was an acceptable plan.

## 2013-05-26 ENCOUNTER — Encounter (HOSPITAL_COMMUNITY): Payer: Self-pay | Admitting: *Deleted

## 2013-05-26 ENCOUNTER — Encounter (HOSPITAL_COMMUNITY)
Admission: EM | Disposition: A | Discharge status: Home / Self Care | Payer: Self-pay | Source: Home / Self Care | Attending: Internal Medicine

## 2013-05-26 HISTORY — PX: ESOPHAGOGASTRODUODENOSCOPY: SHX5428

## 2013-05-26 LAB — CBC
HCT: 25.3 % — ABNORMAL LOW (ref 39.0–52.0)
HEMOGLOBIN: 8.9 g/dL — AB (ref 13.0–17.0)
MCH: 34 pg (ref 26.0–34.0)
MCHC: 35.2 g/dL (ref 30.0–36.0)
MCV: 96.6 fL (ref 78.0–100.0)
Platelets: 55 10*3/uL — ABNORMAL LOW (ref 150–400)
RBC: 2.62 MIL/uL — AB (ref 4.22–5.81)
RDW: 15.1 % (ref 11.5–15.5)
WBC: 4.7 10*3/uL (ref 4.0–10.5)

## 2013-05-26 LAB — HEPATITIS B SURFACE ANTIBODY,QUALITATIVE: Hep B S Ab: NEGATIVE

## 2013-05-26 LAB — HEMOGLOBIN AND HEMATOCRIT, BLOOD
HCT: 24.3 % — ABNORMAL LOW (ref 39.0–52.0)
HCT: 26.4 % — ABNORMAL LOW (ref 39.0–52.0)
HEMATOCRIT: 23.7 % — AB (ref 39.0–52.0)
HEMOGLOBIN: 8.4 g/dL — AB (ref 13.0–17.0)
Hemoglobin: 9.2 g/dL — ABNORMAL LOW (ref 13.0–17.0)
Hemoglobin: 8.3 g/dL — ABNORMAL LOW (ref 13.0–17.0)

## 2013-05-26 LAB — BASIC METABOLIC PANEL
BUN: 34 mg/dL — ABNORMAL HIGH (ref 6–23)
CO2: 21 mEq/L (ref 19–32)
Calcium: 7.9 mg/dL — ABNORMAL LOW (ref 8.4–10.5)
Chloride: 111 mEq/L (ref 96–112)
Creatinine, Ser: 0.98 mg/dL (ref 0.50–1.35)
GFR calc non Af Amer: 88 mL/min — ABNORMAL LOW (ref >90)
Glucose, Bld: 89 mg/dL (ref 70–99)
POTASSIUM: 4.4 meq/L (ref 3.7–5.3)
SODIUM: 140 meq/L (ref 137–147)

## 2013-05-26 LAB — TYPE AND SCREEN
ABO/RH(D): A POS
ANTIBODY SCREEN: NEGATIVE

## 2013-05-26 LAB — ABO/RH: ABO/RH(D): A POS

## 2013-05-26 LAB — HEPATITIS C ANTIBODY: HCV Ab: REACTIVE — AB

## 2013-05-26 LAB — HEPATITIS B SURFACE ANTIGEN: HEP B S AG: NEGATIVE

## 2013-05-26 SURGERY — EGD (ESOPHAGOGASTRODUODENOSCOPY)
Anesthesia: Moderate Sedation

## 2013-05-26 MED ORDER — DIPHENHYDRAMINE HCL 50 MG/ML IJ SOLN
12.5000 mg | Freq: Once | INTRAMUSCULAR | Status: AC
  Administered 2013-05-26: 12.5 mg via INTRAVENOUS
  Filled 2013-05-26: qty 1

## 2013-05-26 MED ORDER — MIDAZOLAM HCL 5 MG/ML IJ SOLN
INTRAMUSCULAR | Status: AC
  Filled 2013-05-26: qty 3

## 2013-05-26 MED ORDER — BUTAMBEN-TETRACAINE-BENZOCAINE 2-2-14 % EX AERO
INHALATION_SPRAY | CUTANEOUS | Status: DC | PRN
  Administered 2013-05-26: 2 via TOPICAL

## 2013-05-26 MED ORDER — DIPHENHYDRAMINE HCL 25 MG PO CAPS
25.0000 mg | ORAL_CAPSULE | ORAL | Status: DC | PRN
  Administered 2013-05-27 (×3): 25 mg via ORAL
  Filled 2013-05-26 (×3): qty 1

## 2013-05-26 MED ORDER — MIDAZOLAM HCL 10 MG/2ML IJ SOLN
INTRAMUSCULAR | Status: DC | PRN
  Administered 2013-05-26 (×2): 2 mg via INTRAVENOUS

## 2013-05-26 MED ORDER — SODIUM CHLORIDE 0.9 % IV SOLN: INTRAVENOUS | Status: DC

## 2013-05-26 MED ORDER — FENTANYL CITRATE 0.05 MG/ML IJ SOLN
INTRAMUSCULAR | Status: AC
  Filled 2013-05-26: qty 4

## 2013-05-26 MED ORDER — FENTANYL CITRATE 0.05 MG/ML IJ SOLN
INTRAMUSCULAR | Status: DC | PRN
  Administered 2013-05-26 (×2): 25 ug via INTRAVENOUS

## 2013-05-26 MED ORDER — DIPHENHYDRAMINE HCL 50 MG/ML IJ SOLN
INTRAMUSCULAR | Status: AC
  Filled 2013-05-26: qty 1

## 2013-05-26 NOTE — Consult Note (Signed)
Unassigned Consult  Reason for Consult: Hematemesis and cirrhosis Referring Physician: [facility]  Tom Chang HPI: This is 58 year old male who was admitted for complaints of hematemesis and melena.  His symptoms started one day prior to admission.  He had some abdominal pain and he reports taking Aleve and Advil for his rib fractures.  This occurred when he was building a sandbox.  He has a long history of ETOH abuse, but he denies any IVDA.  Further evaluation with a CT scan reveals cirrhosis.  He denies any prior issues with bleeding in the past.  Past Medical History  Diagnosis Date  . Acid reflux     Past Surgical History  Procedure Laterality Date  . Upper gastrointestinal endoscopy    . Back surgery    . Anterior cruciate ligament repair  1996    x 2 left knee  . Appendectomy      History reviewed. No pertinent family history.  Social History:  reports that he has been smoking Cigarettes.  He has a 80 pack-year smoking history. He has never used smokeless tobacco. He reports that he drinks about 2.4 ounces of alcohol per week. He reports that he does not use illicit drugs.  Allergies:  Allergies  Allergen Reactions  . Codeine Itching    Medications:  Scheduled: . diazepam  5 mg Oral BID  . folic acid  1 mg Oral Daily  . LORazepam  0-4 mg Intravenous Q6H   Followed by  . [START ON 05/28/2013] LORazepam  0-4 mg Intravenous Q12H  . multivitamin with minerals  1 tablet Oral Daily  . nicotine  21 mg Transdermal Daily  . [START ON 05/29/2013] pantoprazole (PROTONIX) IV  40 mg Intravenous Q12H  . pneumococcal 23 valent vaccine  0.5 mL Intramuscular Tomorrow-1000  . polyvinyl alcohol  1 drop Both Eyes Daily  . thiamine  100 mg Oral Daily   Or  . thiamine  100 mg Intravenous Daily  . zolpidem  5 mg Oral QHS   Continuous: . sodium chloride 75 mL/hr at 05/25/13 2231  . pantoprozole (PROTONIX) infusion 8 mg/hr (05/25/13 2337)    Results for orders placed  during the hospital encounter of 05/25/13 (from the past 24 hour(s))  URINALYSIS, ROUTINE W REFLEX MICROSCOPIC     Status: Abnormal   Collection Time    05/25/13  3:21 PM      Result Value Ref Range   Color, Urine AMBER (*) YELLOW   APPearance CLEAR  CLEAR   Specific Gravity, Urine 1.028  1.005 - 1.030   pH 6.0  5.0 - 8.0   Glucose, UA NEGATIVE  NEGATIVE mg/dL   Hgb urine dipstick NEGATIVE  NEGATIVE   Bilirubin Urine SMALL (*) NEGATIVE   Ketones, ur NEGATIVE  NEGATIVE mg/dL   Protein, ur NEGATIVE  NEGATIVE mg/dL   Urobilinogen, UA 2.0 (*) 0.0 - 1.0 mg/dL   Nitrite NEGATIVE  NEGATIVE   Leukocytes, UA SMALL (*) NEGATIVE  URINE MICROSCOPIC-ADD ON     Status: Abnormal   Collection Time    05/25/13  3:21 PM      Result Value Ref Range   Squamous Epithelial / LPF RARE  RARE   WBC, UA 0-2  <3 WBC/hpf   Bacteria, UA FEW (*) RARE   Urine-Other MUCOUS PRESENT    CBC     Status: Abnormal   Collection Time    05/25/13  3:45 PM      Result Value Ref Range  WBC 10.5  4.0 - 10.5 K/uL   RBC 3.38 (*) 4.22 - 5.81 MIL/uL   Hemoglobin 11.6 (*) 13.0 - 17.0 g/dL   HCT 16.1 (*) 09.6 - 04.5 %   MCV 98.5  78.0 - 100.0 fL   MCH 34.3 (*) 26.0 - 34.0 pg   MCHC 34.8  30.0 - 36.0 g/dL   RDW 40.9  81.1 - 91.4 %   Platelets PLATELETS APPEAR DECREASED  150 - 400 K/uL  COMPREHENSIVE METABOLIC PANEL     Status: Abnormal   Collection Time    05/25/13  3:45 PM      Result Value Ref Range   Sodium 145  137 - 147 mEq/L   Potassium 4.3  3.7 - 5.3 mEq/L   Chloride 110  96 - 112 mEq/L   CO2 24  19 - 32 mEq/L   Glucose, Bld 105 (*) 70 - 99 mg/dL   BUN 33 (*) 6 - 23 mg/dL   Creatinine, Ser 7.82  0.50 - 1.35 mg/dL   Calcium 9.0  8.4 - 95.6 mg/dL   Total Protein 6.7  6.0 - 8.3 g/dL   Albumin 2.9 (*) 3.5 - 5.2 g/dL   AST 90 (*) 0 - 37 U/L   ALT 64 (*) 0 - 53 U/L   Alkaline Phosphatase 94  39 - 117 U/L   Total Bilirubin 1.5 (*) 0.3 - 1.2 mg/dL   GFR calc non Af Amer >90  >90 mL/min   GFR calc Af Amer >90   >90 mL/min  PROTIME-INR     Status: Abnormal   Collection Time    05/25/13  3:45 PM      Result Value Ref Range   Prothrombin Time 16.0 (*) 11.6 - 15.2 seconds   INR 1.31  0.00 - 1.49  OCCULT BLOOD X 1 CARD TO LAB, STOOL     Status: Abnormal   Collection Time    05/25/13  4:00 PM      Result Value Ref Range   Fecal Occult Bld POSITIVE (*) NEGATIVE  HEMOGLOBIN AND HEMATOCRIT, BLOOD     Status: Abnormal   Collection Time    05/26/13 12:50 AM      Result Value Ref Range   Hemoglobin 8.4 (*) 13.0 - 17.0 g/dL   HCT 21.3 (*) 08.6 - 57.8 %  TYPE AND SCREEN     Status: None   Collection Time    05/26/13 12:50 AM      Result Value Ref Range   ABO/RH(D) A POS     Antibody Screen NEG     Sample Expiration 05/29/2013    ABO/RH     Status: None   Collection Time    05/26/13 12:50 AM      Result Value Ref Range   ABO/RH(D) A POS    BASIC METABOLIC PANEL     Status: Abnormal   Collection Time    05/26/13  6:30 AM      Result Value Ref Range   Sodium 140  137 - 147 mEq/L   Potassium 4.4  3.7 - 5.3 mEq/L   Chloride 111  96 - 112 mEq/L   CO2 21  19 - 32 mEq/L   Glucose, Bld 89  70 - 99 mg/dL   BUN 34 (*) 6 - 23 mg/dL   Creatinine, Ser 4.69  0.50 - 1.35 mg/dL   Calcium 7.9 (*) 8.4 - 10.5 mg/dL   GFR calc non Af Amer 88 (*) >90 mL/min  GFR calc Af Amer >90  >90 mL/min  CBC     Status: Abnormal (Preliminary result)   Collection Time    05/26/13  6:30 AM      Result Value Ref Range   WBC 4.7  4.0 - 10.5 K/uL   RBC 2.62 (*) 4.22 - 5.81 MIL/uL   Hemoglobin 8.9 (*) 13.0 - 17.0 g/dL   HCT 16.125.3 (*) 09.639.0 - 04.552.0 %   MCV 96.6  78.0 - 100.0 fL   MCH 34.0  26.0 - 34.0 pg   MCHC 35.2  30.0 - 36.0 g/dL   RDW 40.915.1  81.111.5 - 91.415.5 %   Platelets PENDING  150 - 400 K/uL     Ct Chest W Contrast  05/25/2013   CLINICAL DATA:  Fall 6 weeks ago. Right rib pain and abdominal pain.  EXAM: CT CHEST, ABDOMEN, AND PELVIS WITH CONTRAST  TECHNIQUE: Multidetector CT imaging of the chest, abdomen and pelvis  was performed following the standard protocol during bolus administration of intravenous contrast.  CONTRAST:  100mL OMNIPAQUE IOHEXOL 300 MG/ML  SOLN  COMPARISON:  None.  FINDINGS: CT CHEST FINDINGS  Lungs are clear. No pulmonary nodules. No effusions. Insert Heart coronary artery calcifications in the left anterior descending and likely in the right coronary artery. Aorta is normal caliber with scattered calcifications.  No mediastinal, hilar, or axillary adenopathy. Chest wall soft tissues are unremarkable.  Rib fractures are noted through the lateral posterior right tenth and eleventh ribs. No pneumothorax.  CT ABDOMEN AND PELVIS FINDINGS  Changes of cirrhosis with nodular contours of the liver. Associated dilated portal vein compatible with portal venous hypertension and splenomegaly. Spleen measures 18 cm in craniocaudal length. Associated small amount of pelvic, perihepatic and perisplenic ascites.  Aorta and iliac vessels are heavily calcified, non aneurysmal.  Stomach, large and small bowel are grossly unremarkable. Urinary bladder is decompressed, grossly unremarkable. No additional acute bony abnormality.  IMPRESSION: Right posterior lateral tenth and eleventh rib fractures which are mildly displaced. No associated pneumothorax or effusion.  Changes of cirrhosis with associated mild ascites and splenomegaly.  Coronary artery disease.   Electronically Signed   By: Charlett NoseKevin  Dover M.D.   On: 05/25/2013 18:00   Ct Abdomen Pelvis W Contrast  05/25/2013   CLINICAL DATA:  Fall 6 weeks ago. Right rib pain and abdominal pain.  EXAM: CT CHEST, ABDOMEN, AND PELVIS WITH CONTRAST  TECHNIQUE: Multidetector CT imaging of the chest, abdomen and pelvis was performed following the standard protocol during bolus administration of intravenous contrast.  CONTRAST:  100mL OMNIPAQUE IOHEXOL 300 MG/ML  SOLN  COMPARISON:  None.  FINDINGS: CT CHEST FINDINGS  Lungs are clear. No pulmonary nodules. No effusions. Insert Heart  coronary artery calcifications in the left anterior descending and likely in the right coronary artery. Aorta is normal caliber with scattered calcifications.  No mediastinal, hilar, or axillary adenopathy. Chest wall soft tissues are unremarkable.  Rib fractures are noted through the lateral posterior right tenth and eleventh ribs. No pneumothorax.  CT ABDOMEN AND PELVIS FINDINGS  Changes of cirrhosis with nodular contours of the liver. Associated dilated portal vein compatible with portal venous hypertension and splenomegaly. Spleen measures 18 cm in craniocaudal length. Associated small amount of pelvic, perihepatic and perisplenic ascites.  Aorta and iliac vessels are heavily calcified, non aneurysmal.  Stomach, large and small bowel are grossly unremarkable. Urinary bladder is decompressed, grossly unremarkable. No additional acute bony abnormality.  IMPRESSION: Right posterior lateral tenth and eleventh rib  fractures which are mildly displaced. No associated pneumothorax or effusion.  Changes of cirrhosis with associated mild ascites and splenomegaly.  Coronary artery disease.   Electronically Signed   By: Charlett Nose M.D.   On: 05/25/2013 18:00    ROS:  As stated above in the HPI otherwise negative.  Blood pressure 118/75, pulse 88, temperature 97.6 F (36.4 C), temperature source Oral, resp. rate 18, height 6\' 1"  (1.854 m), weight 222 lb 1.6 oz (100.744 kg), SpO2 96.00%.    PE: Gen: NAD, Alert and Oriented HEENT:  [place]/AT, EOMI Neck: Supple, no LAD Lungs: CTA Bilaterally CV: RRR without M/G/R ABM: Soft, NTND, +BS Ext: No C/C/E  Assessment/Plan: 1) Hematemesis. 2) Anemia. 3) Cirrhosis. 4) Abnormal liver enzymes.   I am concerned that he may have a variceal bleed.  Further evaluation with an EGD is required at this time.  Plan: 1) EGD today. 2) Check for HBV and HCV.  Theda Belfast 05/26/2013, 7:56 AM

## 2013-05-26 NOTE — Progress Notes (Signed)
Pt complaining of mild itching, Lynch, NP notified. Benadryl IV ordered. Will continue to monitor.

## 2013-05-26 NOTE — Op Note (Signed)
[facility] [address] [place], [id number]   OPERATIVE PROCEDURE REPORT  PATIENT: [name], Tom R.  MR#: [id number] BIRTHDATE: October 19, 1955  GENDER: Male ENDOSCOPIST: Jeani HawkingPatrick Jewelle Whitner, MD ASSISTANT:   Nilsa NuttingFelicia Akande, Endo Technician and Levie HeritageBethany Amos, RN, BSN PROCEDURE DATE: 05/26/2013 PROCEDURE:   EGD ASA CLASS:   Class III INDICATIONS: Hematemesis MEDICATIONS: Versed 4 mg IV and Fentanyl 50 mcg IV TOPICAL ANESTHETIC:   Cetacaine Spray  DESCRIPTION OF PROCEDURE:   After the risks benefits and alternatives of the procedure were thoroughly explained, informed consent was obtained.  The Pentax Gastroscope [id number]  endoscope was introduced through the mouth  and advanced to the second portion of the duodenum Without limitations.      The instrument was slowly withdrawn as the mucosa was fully examined.    FINDINGS: The Z-line was irregular and consistent with a gross appearance of Barrett's, which was mild.  In light of his cirrhosis, no biopsies were obtained.  A small 2 cm hiatal hernia was found.  There was no clear evidence of any esophageal varices. The gastric lumen exhibited a few very small antral erosions.  No ulcerations, polyp, masses, or recent evidence of bleeding.  Portal HTN gastropathy was noted.  No evidence of fundic varices.  The duodenum was normal.   Retroflexed views revealed no abnormalities. The scope was then withdrawn from the patient and the procedure terminated.  COMPLICATIONS: There were no complications.  IMPRESSION: 1) Irregular Z-line 2) 2 cm hiatal hernia. 3) Portal HTN gastropathy. 4) Small antral erosions.  RECOMMENDATIONS: 1) Avoid NSAIDs. 2) Stop drinking ETOH. 3) Follow up in one month.  _______________________________ eSigned:  Jeani HawkingPatrick Keiston Manley, MD 05/26/2013 10:46 AM

## 2013-05-26 NOTE — Progress Notes (Addendum)
[facility] PROGRESS NOTE  Deatra InaSteven R Mundt ZOX:[id number] DOB: [birth year] DOA: 05/25/2013 PCP: No PCP Per Patient  Assessment/Plan: 1. Hematemesis and black stools: - admitted to telemetry.  underwent EGD, showed antral erosions and portal hypertensive gastropathy. Recommendations to stop alcohol and NSAIDS.  - h&H is stable today.  - continue to monitor.  - hep B , C Pending.   2. Alcohol abuse: - on ciwa.  3. Cirrhosis: possibly from alcohol abuse  4. Thrombocytopenia: from cirrhosis.   5. Anemia of blood loss: - transfuse to keep it > 8. Today is 9.2   DVT prophylaxis.   Code Status: FULL CODE Family Communication: NONE AT BEDSIDE Disposition Plan:HOME in am.   Consultants:  GI  Procedures:  EGD  Antibiotics:  none  HPI/Subjective: Denies any new complaints.   Objective: Filed Vitals:   05/26/13 1457  BP: 100/64  Pulse: 86  Temp: 98.2 F (36.8 C)  Resp: 16    Intake/Output Summary (Last 24 hours) at 05/26/13 1614 Last data filed at 05/26/13 0843  Gross per 24 hour  Intake 1587.5 ml  Output      0 ml  Net 1587.5 ml   Filed Weights   05/25/13 1511 05/25/13 2100  Weight: 104.327 kg (230 lb) 100.744 kg (222 lb 1.6 oz)    Exam: Pt is sleeping comfortably, drowsy on waking up CV S1S2 Lungs CTAB Abd: soft NT ND BS+ EXT: no pedal edema.  Data Reviewed: Basic Metabolic Panel:  Recent Labs Lab 05/25/13 1545 05/26/13 0630  NA 145 140  K 4.3 4.4  CL 110 111  CO2 24 21  GLUCOSE 105* 89  BUN 33* 34*  CREATININE 0.90 0.98  CALCIUM 9.0 7.9*   Liver Function Tests:  Recent Labs Lab 05/25/13 1545  AST 90*  ALT 64*  ALKPHOS 94  BILITOT 1.5*  PROT 6.7  ALBUMIN 2.9*   No results found for this basename: LIPASE, AMYLASE,  in the last 168 hours No results found for this basename: AMMONIA,  in the last 168 hours CBC:  Recent Labs Lab 05/25/13 1545 05/26/13 0050 05/26/13 0630 05/26/13 1330  WBC 10.5  --  4.7  --   HGB  11.6* 8.4* 8.9* 9.2*  HCT 33.3* 24.3* 25.3* 26.4*  MCV 98.5  --  96.6  --   PLT PLATELETS APPEAR DECREASED  --  55*  --    Cardiac Enzymes: No results found for this basename: CKTOTAL, CKMB, CKMBINDEX, TROPONINI,  in the last 168 hours BNP (last 3 results) No results found for this basename: PROBNP,  in the last 8760 hours CBG: No results found for this basename: GLUCAP,  in the last 168 hours  No results found for this or any previous visit (from the past 240 hour(s)).   Studies: Ct Chest W Contrast  05/25/2013   CLINICAL DATA:  Fall 6 weeks ago. Right rib pain and abdominal pain.  EXAM: CT CHEST, ABDOMEN, AND PELVIS WITH CONTRAST  TECHNIQUE: Multidetector CT imaging of the chest, abdomen and pelvis was performed following the standard protocol during bolus administration of intravenous contrast.  CONTRAST:  100mL OMNIPAQUE IOHEXOL 300 MG/ML  SOLN  COMPARISON:  None.  FINDINGS: CT CHEST FINDINGS  Lungs are clear. No pulmonary nodules. No effusions. Insert Heart coronary artery calcifications in the left anterior descending and likely in the right coronary artery. Aorta is normal caliber with scattered calcifications.  No mediastinal, hilar, or axillary adenopathy. Chest wall soft tissues are unremarkable.  Rib fractures  are noted through the lateral posterior right tenth and eleventh ribs. No pneumothorax.  CT ABDOMEN AND PELVIS FINDINGS  Changes of cirrhosis with nodular contours of the liver. Associated dilated portal vein compatible with portal venous hypertension and splenomegaly. Spleen measures 18 cm in craniocaudal length. Associated small amount of pelvic, perihepatic and perisplenic ascites.  Aorta and iliac vessels are heavily calcified, non aneurysmal.  Stomach, large and small bowel are grossly unremarkable. Urinary bladder is decompressed, grossly unremarkable. No additional acute bony abnormality.  IMPRESSION: Right posterior lateral tenth and eleventh rib fractures which are mildly  displaced. No associated pneumothorax or effusion.  Changes of cirrhosis with associated mild ascites and splenomegaly.  Coronary artery disease.   Electronically Signed   By: Charlett NoseKevin  Dover M.D.   On: 05/25/2013 18:00   Ct Abdomen Pelvis W Contrast  05/25/2013   CLINICAL DATA:  Fall 6 weeks ago. Right rib pain and abdominal pain.  EXAM: CT CHEST, ABDOMEN, AND PELVIS WITH CONTRAST  TECHNIQUE: Multidetector CT imaging of the chest, abdomen and pelvis was performed following the standard protocol during bolus administration of intravenous contrast.  CONTRAST:  100mL OMNIPAQUE IOHEXOL 300 MG/ML  SOLN  COMPARISON:  None.  FINDINGS: CT CHEST FINDINGS  Lungs are clear. No pulmonary nodules. No effusions. Insert Heart coronary artery calcifications in the left anterior descending and likely in the right coronary artery. Aorta is normal caliber with scattered calcifications.  No mediastinal, hilar, or axillary adenopathy. Chest wall soft tissues are unremarkable.  Rib fractures are noted through the lateral posterior right tenth and eleventh ribs. No pneumothorax.  CT ABDOMEN AND PELVIS FINDINGS  Changes of cirrhosis with nodular contours of the liver. Associated dilated portal vein compatible with portal venous hypertension and splenomegaly. Spleen measures 18 cm in craniocaudal length. Associated small amount of pelvic, perihepatic and perisplenic ascites.  Aorta and iliac vessels are heavily calcified, non aneurysmal.  Stomach, large and small bowel are grossly unremarkable. Urinary bladder is decompressed, grossly unremarkable. No additional acute bony abnormality.  IMPRESSION: Right posterior lateral tenth and eleventh rib fractures which are mildly displaced. No associated pneumothorax or effusion.  Changes of cirrhosis with associated mild ascites and splenomegaly.  Coronary artery disease.   Electronically Signed   By: Charlett NoseKevin  Dover M.D.   On: 05/25/2013 18:00    Scheduled Meds: . diazepam  5 mg Oral BID  .  folic acid  1 mg Oral Daily  . LORazepam  0-4 mg Intravenous Q6H   Followed by  . [START ON 05/28/2013] LORazepam  0-4 mg Intravenous Q12H  . multivitamin with minerals  1 tablet Oral Daily  . nicotine  21 mg Transdermal Daily  . [START ON 05/29/2013] pantoprazole (PROTONIX) IV  40 mg Intravenous Q12H  . polyvinyl alcohol  1 drop Both Eyes Daily  . thiamine  100 mg Oral Daily   Or  . thiamine  100 mg Intravenous Daily  . zolpidem  5 mg Oral QHS   Continuous Infusions: . sodium chloride 75 mL/hr at 05/26/13 1049  . pantoprozole (PROTONIX) infusion 8 mg/hr (05/26/13 0843)    Principal Problem:   Upper GI bleed Active Problems:   Multiple rib fractures   Alcohol abuse, unspecified   Tobacco use disorder   Acute blood loss anemia   Alcoholic cirrhosis    Time spent: 25 min    Kathlen ModyVijaya Calia Napp  [facility] Pager [phone] If 7PM-7AM, please contact night-coverage at [email or web address], password [email or web address] 05/26/2013, 4:14 PM  LOS: 1 day

## 2013-05-26 NOTE — H&P (Signed)
[facility] History and Physical  Tom Chang ZOX:[id number] DOB: 1955/07/17 DOA: 05/25/2013  Referring physician:  EDP PCP: No PCP Per Patient  Specialists:   Chief Complaint: Vomited Blood  HPI: Tom Chang is a 58 y.o. male who presented to the [facility] ED with complaints of hematemesis of coffee ground emesis x 1 last night followed by passing melena x 2 today.   He reports having nausea and epigastric ABD pain x 1 day.  He has a fall 6 weeks ago and suffered multiple rib fractures and has had increased pain since, so he has taken increased amounts of OTC ADvil and Aleve for his pain.    He is a smoker and a drinker.   At the [facility] ED a t Scan was performed of the ABD and pelvis which revealed cirrhosis, and mild ascites and multiple rib fractures.   His initial hemoglobin level was found to be 11.6.   GI was consulted by the ED and patient was transferred to [facility] for admission and to see Dr Elnoria Howard in the AM.        Review of Systems:  Constitutional: No Weight Loss, No Weight Gain, Night Sweats, Fevers, Chills, Fatigue, +Generalized Weakness HEENT: No Headaches, Difficulty Swallowing,Tooth/Dental Problems,Sore Throat,  No Sneezing, Rhinitis, Ear Ache, Nasal Congestion, or Post Nasal Drip,  Cardio-vascular:  No Chest pain, Orthopnea, PND, Edema in lower extremities, Anasarca, Dizziness, Palpitations  Resp: No Dyspnea, No DOE, No Productive Cough, No Non-Productive Cough, No Hemoptysis, No Change in Color of Mucus,  No Wheezing.    GI: No Heartburn, Indigestion,+Abdominal Pain, +Nausea, +Vomiting, Diarrhea, +Hematemesis, +Melena,  Change in Bowel Habits,  Loss of Appetite  GU: No Dysuria, Change in Color of Urine, No Urgency or Frequency.  No flank pain.  Musculoskeletal: +Right Lower Rib Pain and Right Hip/Pelvic Pain,   No Decreased Range of Motion. No Back Pain.  Neurologic: No Syncope, No Seizures, Muscle Weakness, Paresthesia, Vision Disturbance or Loss, No Diplopia, No  Vertigo, No Difficulty Walking,  Skin: No Rash or Lesions. Psych: No Change in Mood or Affect. No Depression or Anxiety. No Memory loss. No Confusion or Hallucinations   Past Medical History  Diagnosis Date  . Acid reflux       Past Surgical History  Procedure Laterality Date  . Upper gastrointestinal endoscopy    . Back surgery    . Anterior cruciate ligament repair  1996    x 2 left knee  . Appendectomy         Prior to Admission medications   Medication Sig Start Date End Date Taking? Authorizing Provider  diazepam (VALIUM) 5 MG tablet Take 1 tablet (5 mg total) by mouth 2 (two) times daily. 04/11/13   Gerhard Munch, MD  ibuprofen (ADVIL,MOTRIN) 600 MG tablet Take 1 tablet (600 mg total) by mouth every 6 (six) hours as needed. 04/18/13   Shanna Cisco, MD  ibuprofen (ADVIL,MOTRIN) 800 MG tablet Take 1 tablet (800 mg total) by mouth 3 (three) times daily. 04/11/13   Gerhard Munch, MD  magnesium oxide (MAG-OX) 400 MG tablet Take 400 mg by mouth daily.      Historical Provider, MD  naphazoline (CLEAR EYES) 0.012 % ophthalmic solution Place 1 drop into both eyes daily.      Historical Provider, MD  oxyCODONE-acetaminophen (PERCOCET) 10-325 MG per tablet Take 1 tablet by mouth every 6 (six) hours as needed for pain. 04/18/13   Shanna Cisco, MD  oxyCODONE-acetaminophen (PERCOCET/ROXICET) [phone]  MG per tablet Take 1 tablet by mouth every 6 (six) hours as needed for severe pain. 04/11/13   Gerhard Munchobert Lockwood, MD  Potassium (POTASSIMIN PO) Take 1 tablet by mouth daily.      Historical Provider, MD  zolpidem (AMBIEN) 5 MG tablet Take 5 mg by mouth at bedtime.     Historical Provider, MD      Allergies  Allergen Reactions  . Codeine Itching     Social History:  reports that he has been smoking Cigarettes.  He has a 80 pack-year smoking history. He has never used smokeless tobacco. He reports that he drinks about 2.4 ounces of alcohol per week. He reports that he does not use  illicit drugs.     History reviewed. No pertinent family history.     Physical Exam:  GEN:  Pleasant Elderly appearing 58 y.o. Caucasian male examined and in discomfort but no acute distress; cooperative with exam Filed Vitals:   05/25/13 1915 05/25/13 2009 05/25/13 2100 05/26/13 0010  BP: 114/80 142/91 115/78 120/76  Pulse: 99 102 88 84  Temp:   98.1 F (36.7 C) 98 F (36.7 C)  TempSrc:   Oral Oral  Resp: 20 16 16 16   Height:   6\' 1"  (1.854 m)   Weight:   100.744 kg (222 lb 1.6 oz)   SpO2: 98% 99% 95% 96%   Blood pressure 120/76, pulse 84, temperature 98 F (36.7 C), temperature source Oral, resp. rate 16, height 6\' 1"  (1.854 m), weight 100.744 kg (222 lb 1.6 oz), SpO2 96.00%. PSYCH: He is alert and oriented x4; does not appear anxious does not appear depressed; affect is normal HEENT: Normocephalic and Atraumatic, Mucous membranes pink; PERRLA; EOM intact; Fundi:  Benign;  No scleral icterus, Nares: Patent, Oropharynx: Clear, Edentulous with Dentures, Neck:  FROM, no cervical lymphadenopathy nor thyromegaly or carotid bruit; no JVD; Breasts:: Not examined CHEST WALL: + Tenderness along the lower Aspect of the Right Lateral and Lower Ribs.    CHEST: Normal respiration, clear to auscultation bilaterally HEART: Regular rate and rhythm; no murmurs rubs or gallops BACK: No kyphosis or scoliosis; no CVA tenderness ABDOMEN: Positive Bowel Sounds, soft non-tender; no masses, no organomegaly Rectal Exam: Not done EXTREMITIES: No cyanosis, clubbing or edema; no ulcerations. Genitalia: not examined PULSES: 2+ and symmetric SKIN: Normal hydration no rash or ulceration CNS:  Alert and oriented X 4, No Focal Deficits.    Vascular: pulses palpable throughout    Labs on Admission:  Basic Metabolic Panel:  Recent Labs Lab 05/25/13 1545  NA 145  K 4.3  CL 110  CO2 24  GLUCOSE 105*  BUN 33*  CREATININE 0.90  CALCIUM 9.0   Liver Function Tests:  Recent Labs Lab 05/25/13 1545   AST 90*  ALT 64*  ALKPHOS 94  BILITOT 1.5*  PROT 6.7  ALBUMIN 2.9*   No results found for this basename: LIPASE, AMYLASE,  in the last 168 hours No results found for this basename: AMMONIA,  in the last 168 hours CBC:  Recent Labs Lab 05/25/13 1545  WBC 10.5  HGB 11.6*  HCT 33.3*  MCV 98.5  PLT PLATELETS APPEAR DECREASED   Cardiac Enzymes: No results found for this basename: CKTOTAL, CKMB, CKMBINDEX, TROPONINI,  in the last 168 hours  BNP (last 3 results) No results found for this basename: PROBNP,  in the last 8760 hours CBG: No results found for this basename: GLUCAP,  in the last 168 hours  Radiological Exams on  Admission: Ct Chest W Contrast  05/25/2013   CLINICAL DATA:  Fall 6 weeks ago. Right rib pain and abdominal pain.  EXAM: CT CHEST, ABDOMEN, AND PELVIS WITH CONTRAST  TECHNIQUE: Multidetector CT imaging of the chest, abdomen and pelvis was performed following the standard protocol during bolus administration of intravenous contrast.  CONTRAST:  OMNIPAQUE IOHEXOL 300 MG/ML  SOLN  COMPARISON:  None.  FINDINGS: CT CHEST FINDINGS  Lungs are clear. No pulmonary nodules. No effusions. Insert Heart coronary artery calcifications in the left anterior descending and likely in the right coronary artery. Aorta is normal caliber with scattered calcifications.  No mediastinal, hilar, or axillary adenopathy. Chest wall soft tissues are unremarkable.  Rib fractures are noted through the lateral posterior right tenth and eleventh ribs. No pneumothorax.  CT ABDOMEN AND PELVIS FINDINGS  Changes of cirrhosis with nodular contours of the liver. Associated dilated portal vein compatible with portal venous hypertension and splenomegaly. Spleen measures 18 cm in craniocaudal length. Associated small amount of pelvic, perihepatic and perisplenic ascites.  Aorta and iliac vessels are heavily calcified, non aneurysmal.  Stomach, large and small bowel are grossly unremarkable. Urinary bladder is  decompressed, grossly unremarkable. No additional acute bony abnormality.  IMPRESSION: Right posterior lateral tenth and eleventh rib fractures which are mildly displaced. No associated pneumothorax or effusion.  Changes of cirrhosis with associated mild ascites and splenomegaly.  Coronary artery disease.   Electronically Signed   By: Charlett Nose M.D.   On: 05/25/2013 18:00   Ct Abdomen Pelvis W Contrast  05/25/2013   CLINICAL DATA:  Fall 6 weeks ago. Right rib pain and abdominal pain.  EXAM: CT CHEST, ABDOMEN, AND PELVIS WITH CONTRAST  TECHNIQUE: Multidetector CT imaging of the chest, abdomen and pelvis was performed following the standard protocol during bolus administration of intravenous contrast.  CONTRAST:  OMNIPAQUE IOHEXOL 300 MG/ML  SOLN  COMPARISON:  None.  FINDINGS: CT CHEST FINDINGS  Lungs are clear. No pulmonary nodules. No effusions. Insert Heart coronary artery calcifications in the left anterior descending and likely in the right coronary artery. Aorta is normal caliber with scattered calcifications.  No mediastinal, hilar, or axillary adenopathy. Chest wall soft tissues are unremarkable.  Rib fractures are noted through the lateral posterior right tenth and eleventh ribs. No pneumothorax.  CT ABDOMEN AND PELVIS FINDINGS  Changes of cirrhosis with nodular contours of the liver. Associated dilated portal vein compatible with portal venous hypertension and splenomegaly. Spleen measures 18 cm in craniocaudal length. Associated small amount of pelvic, perihepatic and perisplenic ascites.  Aorta and iliac vessels are heavily calcified, non aneurysmal.  Stomach, large and small bowel are grossly unremarkable. Urinary bladder is decompressed, grossly unremarkable. No additional acute bony abnormality.  IMPRESSION: Right posterior lateral tenth and eleventh rib fractures which are mildly displaced. No associated pneumothorax or effusion.  Changes of cirrhosis with associated mild ascites and  splenomegaly.  Coronary artery disease.   Electronically Signed   By: Charlett Nose M.D.   On: 05/25/2013 18:00      Assessment/Plan:   58 y.o. male with  Principal Problem:   Upper GI bleed Active Problems:   Multiple rib fractures   Alcohol abuse, unspecified   Tobacco use disorder   Acute blood loss anemia   Alcoholic cirrhosis    1.   Upper GI Bleed-  Due to NSAIDS, and ? Hx of Varices( since he has Cirrhosis seen on CT scan)  Telemetry Monitoring, IV Protonix drip.  IVFs,  Monitor H/Hs q 8 Hrs x 6.   NPO after midnight, and GI:  Dr Elnoria HowardHung to see in AM .      2.   Multiple Rib Fractures S/P Fall-  Pain Control PRN.    3.   Alcohol Abuse-  CIWA Protocol ordered with IV Ativan.    4.   Acute Blood Loss Anemia- due to #1.    Transfuse if needed.    5.   Alcoholic Cirrhosis- seen on CT scan.      6.  DVT prophylaxis with SCDs.        Code Status:   FULL CODE    Family Communication:    Wife at Bedside Disposition Plan:   Inpatient  Time spent:  5460 Minutes  Javyon Fontan Velora HecklerC Ota Ebersole [facility] Pager [phone]  If 7PM-7AM, please contact night-coverage [email or web address] Password [email or web address] 05/26/2013, 1:04 AM

## 2013-05-27 ENCOUNTER — Encounter (HOSPITAL_COMMUNITY): Payer: Self-pay | Admitting: Gastroenterology

## 2013-05-27 LAB — BASIC METABOLIC PANEL
BUN: 23 mg/dL (ref 6–23)
CALCIUM: 7.8 mg/dL — AB (ref 8.4–10.5)
CO2: 21 mEq/L (ref 19–32)
Chloride: 109 mEq/L (ref 96–112)
Creatinine, Ser: 1 mg/dL (ref 0.50–1.35)
GFR calc Af Amer: >90 mL/min (ref >90)
GFR, EST NON AFRICAN AMERICAN: 80 mL/min — AB (ref >90)
Glucose, Bld: 92 mg/dL (ref 70–99)
Potassium: 4.2 mEq/L (ref 3.7–5.3)
Sodium: 140 mEq/L (ref 137–147)

## 2013-05-27 LAB — CBC
HCT: 25.6 % — ABNORMAL LOW (ref 39.0–52.0)
Hemoglobin: 8.9 g/dL — ABNORMAL LOW (ref 13.0–17.0)
MCH: 33.5 pg (ref 26.0–34.0)
MCHC: 34.8 g/dL (ref 30.0–36.0)
MCV: 96.2 fL (ref 78.0–100.0)
PLATELETS: 66 10*3/uL — AB (ref 150–400)
RBC: 2.66 MIL/uL — ABNORMAL LOW (ref 4.22–5.81)
RDW: 15 % (ref 11.5–15.5)
WBC: 4.6 10*3/uL (ref 4.0–10.5)

## 2013-05-27 MED ORDER — THIAMINE HCL 100 MG PO TABS: 100.0000 mg | ORAL_TABLET | Freq: Every day | ORAL | Status: AC

## 2013-05-27 MED ORDER — OXYCODONE HCL 5 MG PO TABS: 5.0000 mg | ORAL_TABLET | ORAL | Status: DC | PRN

## 2013-05-27 MED ORDER — FOLIC ACID 1 MG PO TABS: 1.0000 mg | ORAL_TABLET | Freq: Every day | ORAL | Status: AC

## 2013-05-27 NOTE — Discharge Summary (Signed)
Physician Discharge Summary  Tom Chang:[id number] DOB: 06/01/1955 DOA: 05/25/2013  PCP: No PCP Per Patient  Admit date: 05/25/2013 Discharge date: 05/27/2013  Time spent: 30 minutes  Recommendations for Outpatient Follow-up:  1. Follow up with PCP IN ONE WEEK 2. Follow up with Dr Elnoria Howard in 4 weeks.  3. Please stop alcohol and NSAIDS 4. PLEASE get cbc in on eweek at PCP office.   Discharge Diagnoses:  Principal Problem:   Upper GI bleed Active Problems:   Multiple rib fractures   Alcohol abuse, unspecified   Tobacco use disorder   Acute blood loss anemia   Alcoholic cirrhosis   Discharge Condition: improved.   Diet recommendation: regular diet.   Filed Weights   05/25/13 1511 05/25/13 2100  Weight: 104.327 kg (230 lb) 100.744 kg (222 lb 1.6 oz)    History of present illness:  Tom Chang is a 58 y.o. male who presented to the [facility] ED with complaints of hematemesis of coffee ground emesis x 1 last night followed by passing melena x 2 today. He reports having nausea and epigastric ABD pain x 1 day. He has a fall 6 weeks ago and suffered multiple rib fractures and has had increased pain since, so he has taken increased amounts of OTC ADvil and Aleve for his pain. He is a smoker and a drinker. At the [facility] ED a t Scan was performed of the ABD and pelvis which revealed cirrhosis, and mild ascites and multiple rib fractures. His initial hemoglobin level was found to be 11.6. GI was consulted by the ED and patient was transferred to [facility] for admission and to see Dr Elnoria Howard in the AM.    Hospital Course:  1. Hematemesis and black stools: He was initially admitted to telemetry. Gastroenterology consulted , recommended EGD.  underwent EGD, showed antral erosions and portal hypertensive gastropathy. Recommendations to stop alcohol and NSAIDS. H&H is stable around 8. He is known hepatitis C and his hep c antibody is positive. He was never treated for the hepatitis C.  Recommend follow up with GI in 3 to 4 weeks to follow up with EGD and hepatitis C.  2. Alcohol abuse: on CIWA protocol in the hospital. Did not require any ativan at this time.  3. Cirrhosis: possibly from alcohol abuse and hepatitis C . He was never treated for hep c.  4. Thrombocytopenia: from cirrhosis. No bleeding at this time. 5. Anemia: probably a combination of Anemia of blood loss:  - transfuse to keep it > 8. His hemoglobin is 8.9.      Procedures:  EGD ON 4/19  Consultations:  gastroenterology  Discharge Exam: Filed Vitals:   05/27/13 0543  BP: 141/84  Pulse: 93  Temp: 98 F (36.7 C)  Resp: 18    General: alert afebrile comfortable Cardiovascular: s1s2 Respiratory: ctab  Discharge Instructions You were cared for by a hospitalist during your hospital stay. If you have any questions about your discharge medications or the care you received while you were in the hospital after you are discharged, you can call the unit and asked to speak with the hospitalist on call if the hospitalist that took care of you is not available. Once you are discharged, your primary care physician will handle any further medical issues. Please note that NO REFILLS for any discharge medications will be authorized once you are discharged, as it is imperative that you return to your primary care physician (or establish a relationship with a primary  care physician if you do not have one) for your aftercare needs so that they can reassess your need for medications and monitor your lab values.  Discharge Orders   Future Orders Complete By Expires   Diet - low sodium heart healthy  As directed    Discharge instructions  As directed    Scheduling Instructions:   Follow up with PCP in one week Check CBC in one week at PCP office.  Follow up with Dr hung as recommended.       Medication List    STOP taking these medications       naproxen sodium 220 MG tablet  Commonly known as:  ANAPROX       TAKE these medications       folic acid 1 MG tablet  Commonly known as:  FOLVITE  Take 1 tablet (1 mg total) by mouth daily.     naphazoline 0.012 % ophthalmic solution  Commonly known as:  CLEAR EYES  Place 1 drop into both eyes every morning.     oxyCODONE 5 MG immediate release tablet  Commonly known as:  Oxy IR/ROXICODONE  Take 1 tablet (5 mg total) by mouth every 4 (four) hours as needed for moderate pain.     thiamine 100 MG tablet  Take 1 tablet (100 mg total) by mouth daily.       Allergies  Allergen Reactions  . Codeine Itching       Follow-up Information   Follow up with No PCP Per Patient. Schedule an appointment as soon as possible for a visit in 1 week.   Specialty:  General Practice      Follow up with Theda BelfastHUNG,PATRICK D, MD. Schedule an appointment as soon as possible for a visit in 4 weeks.   Specialty:  Gastroenterology   Contact information:   [address], [address] [place] [address] [phone]        The results of significant diagnostics from this hospitalization (including imaging, microbiology, ancillary and laboratory) are listed below for reference.    Significant Diagnostic Studies: Ct Chest W Contrast  05/25/2013   CLINICAL DATA:  Fall 6 weeks ago. Right rib pain and abdominal pain.  EXAM: CT CHEST, ABDOMEN, AND PELVIS WITH CONTRAST  TECHNIQUE: Multidetector CT imaging of the chest, abdomen and pelvis was performed following the standard protocol during bolus administration of intravenous contrast.  CONTRAST:  100mL OMNIPAQUE IOHEXOL 300 MG/ML  SOLN  COMPARISON:  None.  FINDINGS: CT CHEST FINDINGS  Lungs are clear. No pulmonary nodules. No effusions. Insert Heart coronary artery calcifications in the left anterior descending and likely in the right coronary artery. Aorta is normal caliber with scattered calcifications.  No mediastinal, hilar, or axillary adenopathy. Chest wall soft tissues are unremarkable.  Rib fractures are noted  through the lateral posterior right tenth and eleventh ribs. No pneumothorax.  CT ABDOMEN AND PELVIS FINDINGS  Changes of cirrhosis with nodular contours of the liver. Associated dilated portal vein compatible with portal venous hypertension and splenomegaly. Spleen measures 18 cm in craniocaudal length. Associated small amount of pelvic, perihepatic and perisplenic ascites.  Aorta and iliac vessels are heavily calcified, non aneurysmal.  Stomach, large and small bowel are grossly unremarkable. Urinary bladder is decompressed, grossly unremarkable. No additional acute bony abnormality.  IMPRESSION: Right posterior lateral tenth and eleventh rib fractures which are mildly displaced. No associated pneumothorax or effusion.  Changes of cirrhosis with associated mild ascites and splenomegaly.  Coronary artery disease.   Electronically  Signed   By: Charlett NoseKevin  Dover M.D.   On: 05/25/2013 18:00   Ct Abdomen Pelvis W Contrast  05/25/2013   CLINICAL DATA:  Fall 6 weeks ago. Right rib pain and abdominal pain.  EXAM: CT CHEST, ABDOMEN, AND PELVIS WITH CONTRAST  TECHNIQUE: Multidetector CT imaging of the chest, abdomen and pelvis was performed following the standard protocol during bolus administration of intravenous contrast.  CONTRAST:  100mL OMNIPAQUE IOHEXOL 300 MG/ML  SOLN  COMPARISON:  None.  FINDINGS: CT CHEST FINDINGS  Lungs are clear. No pulmonary nodules. No effusions. Insert Heart coronary artery calcifications in the left anterior descending and likely in the right coronary artery. Aorta is normal caliber with scattered calcifications.  No mediastinal, hilar, or axillary adenopathy. Chest wall soft tissues are unremarkable.  Rib fractures are noted through the lateral posterior right tenth and eleventh ribs. No pneumothorax.  CT ABDOMEN AND PELVIS FINDINGS  Changes of cirrhosis with nodular contours of the liver. Associated dilated portal vein compatible with portal venous hypertension and splenomegaly. Spleen  measures 18 cm in craniocaudal length. Associated small amount of pelvic, perihepatic and perisplenic ascites.  Aorta and iliac vessels are heavily calcified, non aneurysmal.  Stomach, large and small bowel are grossly unremarkable. Urinary bladder is decompressed, grossly unremarkable. No additional acute bony abnormality.  IMPRESSION: Right posterior lateral tenth and eleventh rib fractures which are mildly displaced. No associated pneumothorax or effusion.  Changes of cirrhosis with associated mild ascites and splenomegaly.  Coronary artery disease.   Electronically Signed   By: Charlett NoseKevin  Dover M.D.   On: 05/25/2013 18:00    Microbiology: No results found for this or any previous visit (from the past 240 hour(s)).   Labs: Basic Metabolic Panel:  Recent Labs Lab 05/25/13 1545 05/26/13 0630 05/27/13 0429  NA 145 140 140  K 4.3 4.4 4.2  CL 110 111 109  CO2 24 21 21   GLUCOSE 105* 89 92  BUN 33* 34* 23  CREATININE 0.90 0.98 1.00  CALCIUM 9.0 7.9* 7.8*   Liver Function Tests:  Recent Labs Lab 05/25/13 1545  AST 90*  ALT 64*  ALKPHOS 94  BILITOT 1.5*  PROT 6.7  ALBUMIN 2.9*   No results found for this basename: LIPASE, AMYLASE,  in the last 168 hours No results found for this basename: AMMONIA,  in the last 168 hours CBC:  Recent Labs Lab 05/25/13 1545 05/26/13 0050 05/26/13 0630 05/26/13 1330 05/26/13 2126 05/27/13 0429  WBC 10.5  --  4.7  --   --  4.6  HGB 11.6* 8.4* 8.9* 9.2* 8.3* 8.9*  HCT 33.3* 24.3* 25.3* 26.4* 23.7* 25.6*  MCV 98.5  --  96.6  --   --  96.2  PLT PLATELETS APPEAR DECREASED  --  55*  --   --  66*   Cardiac Enzymes: No results found for this basename: CKTOTAL, CKMB, CKMBINDEX, TROPONINI,  in the last 168 hours BNP: BNP (last 3 results) No results found for this basename: PROBNP,  in the last 8760 hours CBG: No results found for this basename: GLUCAP,  in the last 168 hours     Signed:  Kathlen ModyVijaya Fouad Taul  [facility] 05/27/2013, 1:59  PM

## 2013-05-27 NOTE — Progress Notes (Signed)
NURSING PROGRESS NOTE  Deatra InaSteven R Weller [id number] Discharge Data: 05/27/2013 3:40 PM Attending Provider: Kathlen ModyVijaya Akula, MD PCP:No PCP Per Patient     Deatra InaSteven R Ziemann to be D/C'd Home per MD order.  Discussed with the patient the After Visit Summary and all questions fully answered. All IV's discontinued with no bleeding noted. All belongings returned to patient for patient to take home.   Last Vital Signs:  Blood pressure 136/83, pulse 94, temperature 98.7 F (37.1 C), temperature source Oral, resp. rate 16, height 6\' 1"  (1.854 m), weight 100.744 kg (222 lb 1.6 oz), SpO2 96.00%.  Discharge Medication List   Medication List    STOP taking these medications       naproxen sodium 220 MG tablet  Commonly known as:  ANAPROX      TAKE these medications       folic acid 1 MG tablet  Commonly known as:  FOLVITE  Take 1 tablet (1 mg total) by mouth daily.     naphazoline 0.012 % ophthalmic solution  Commonly known as:  CLEAR EYES  Place 1 drop into both eyes every morning.     oxyCODONE 5 MG immediate release tablet  Commonly known as:  Oxy IR/ROXICODONE  Take 1 tablet (5 mg total) by mouth every 4 (four) hours as needed for moderate pain.     thiamine 100 MG tablet  Take 1 tablet (100 mg total) by mouth daily.         Cathlyn Parsonsattha Keyauna Graefe, RN

## 2013-05-27 NOTE — Care Management Note (Signed)
    Page 1 of 1   05/27/2013     5:49:04 PM CARE MANAGEMENT NOTE 05/27/2013  Patient:  Tom Chang,Tom Chang   Account Number:  [id number]  Date Initiated:  05/27/2013  Documentation initiated by:  Letha CapeAYLOR,Trayon Krantz  Subjective/Objective Assessment:   dx abd pain, hematemissi  admit- lives with spouse.     Action/Plan:   Anticipated DC Date:  05/27/2013   Anticipated DC Plan:  HOME/SELF CARE      DC Planning Services  CM consult      Choice offered to / List presented to:             Status of service:  Completed, signed off Medicare Important Message given?   (If response is "NO", the following Medicare IM given date fields will be blank) Date Medicare IM given:   Date Additional Medicare IM given:    Discharge Disposition:  HOME/SELF CARE  Per UR Regulation:  Reviewed for med. necessity/level of care/duration of stay  If discussed at Long Length of Stay Meetings, dates discussed:    Comments:

## 2013-05-28 NOTE — Progress Notes (Signed)
Notified by RN, that patient called to speak to me .  I called Mr Tom Chang at 5811 35am, he reported he had one dark stool. I requested him to go to ED to check his hemoglobin and further work up.   [name], MD.  [phone]

## 2013-10-16 ENCOUNTER — Emergency Department (HOSPITAL_BASED_OUTPATIENT_CLINIC_OR_DEPARTMENT_OTHER): Payer: Medicaid Other

## 2013-10-16 ENCOUNTER — Emergency Department (HOSPITAL_BASED_OUTPATIENT_CLINIC_OR_DEPARTMENT_OTHER)
Admission: EM | Admit: 2013-10-16 | Discharge: 2013-10-16 | Disposition: A | Discharge status: Home / Self Care | Payer: Medicaid Other | Attending: Emergency Medicine | Admitting: Emergency Medicine

## 2013-10-16 ENCOUNTER — Encounter (HOSPITAL_BASED_OUTPATIENT_CLINIC_OR_DEPARTMENT_OTHER): Payer: Self-pay | Admitting: Emergency Medicine

## 2013-10-16 DIAGNOSIS — Y929 Unspecified place or not applicable: Secondary | ICD-10-CM | POA: Diagnosis not present

## 2013-10-16 DIAGNOSIS — Y9389 Activity, other specified: Secondary | ICD-10-CM | POA: Insufficient documentation

## 2013-10-16 DIAGNOSIS — F17200 Nicotine dependence, unspecified, uncomplicated: Secondary | ICD-10-CM | POA: Insufficient documentation

## 2013-10-16 DIAGNOSIS — Z792 Long term (current) use of antibiotics: Secondary | ICD-10-CM | POA: Diagnosis not present

## 2013-10-16 DIAGNOSIS — S2249XA Multiple fractures of ribs, unspecified side, initial encounter for closed fracture: Secondary | ICD-10-CM | POA: Insufficient documentation

## 2013-10-16 DIAGNOSIS — IMO0002 Reserved for concepts with insufficient information to code with codable children: Secondary | ICD-10-CM | POA: Diagnosis present

## 2013-10-16 DIAGNOSIS — Z9889 Other specified postprocedural states: Secondary | ICD-10-CM | POA: Insufficient documentation

## 2013-10-16 DIAGNOSIS — Z8619 Personal history of other infectious and parasitic diseases: Secondary | ICD-10-CM | POA: Insufficient documentation

## 2013-10-16 DIAGNOSIS — S2231XA Fracture of one rib, right side, initial encounter for closed fracture: Secondary | ICD-10-CM

## 2013-10-16 DIAGNOSIS — Z79899 Other long term (current) drug therapy: Secondary | ICD-10-CM | POA: Insufficient documentation

## 2013-10-16 DIAGNOSIS — K769 Liver disease, unspecified: Secondary | ICD-10-CM | POA: Diagnosis not present

## 2013-10-16 DIAGNOSIS — X500XXA Overexertion from strenuous movement or load, initial encounter: Secondary | ICD-10-CM | POA: Diagnosis not present

## 2013-10-16 HISTORY — DX: Liver disease, unspecified: K76.9

## 2013-10-16 HISTORY — DX: Chronic viral hepatitis C: B18.2

## 2013-10-16 LAB — COMPREHENSIVE METABOLIC PANEL
ALK PHOS: 84 U/L (ref 39–117)
ALT: 14 U/L (ref 0–53)
AST: 22 U/L (ref 0–37)
Albumin: 3.2 g/dL — ABNORMAL LOW (ref 3.5–5.2)
Anion gap: 12 (ref 5–15)
BUN: 14 mg/dL (ref 6–23)
CO2: 23 mEq/L (ref 19–32)
Calcium: 9.2 mg/dL (ref 8.4–10.5)
Chloride: 103 mEq/L (ref 96–112)
Creatinine, Ser: 0.8 mg/dL (ref 0.50–1.35)
GFR calc Af Amer: >90 mL/min (ref >90)
GFR calc non Af Amer: >90 mL/min (ref >90)
Glucose, Bld: 138 mg/dL — ABNORMAL HIGH (ref 70–99)
POTASSIUM: 4.1 meq/L (ref 3.7–5.3)
Sodium: 138 mEq/L (ref 137–147)
Total Bilirubin: 1.1 mg/dL (ref 0.3–1.2)
Total Protein: 7.5 g/dL (ref 6.0–8.3)

## 2013-10-16 LAB — CBC WITH DIFFERENTIAL/PLATELET
BASOS PCT: 0 % (ref 0–1)
Basophils Absolute: 0 10*3/uL (ref 0.0–0.1)
Eosinophils Absolute: 0.2 10*3/uL (ref 0.0–0.7)
Eosinophils Relative: 4 % (ref 0–5)
HEMATOCRIT: 32.1 % — AB (ref 39.0–52.0)
HEMOGLOBIN: 10.8 g/dL — AB (ref 13.0–17.0)
LYMPHS PCT: 30 % (ref 12–46)
Lymphs Abs: 1.3 10*3/uL (ref 0.7–4.0)
MCH: 27.6 pg (ref 26.0–34.0)
MCHC: 33.6 g/dL (ref 30.0–36.0)
MCV: 82.1 fL (ref 78.0–100.0)
Monocytes Absolute: 0.6 10*3/uL (ref 0.1–1.0)
Monocytes Relative: 15 % — ABNORMAL HIGH (ref 3–12)
NEUTROS ABS: 2.1 10*3/uL (ref 1.7–7.7)
Neutrophils Relative %: 51 % (ref 43–77)
Platelets: 60 10*3/uL — ABNORMAL LOW (ref 150–400)
RBC: 3.91 MIL/uL — ABNORMAL LOW (ref 4.22–5.81)
RDW: 17.9 % — ABNORMAL HIGH (ref 11.5–15.5)
WBC: 4.2 10*3/uL (ref 4.0–10.5)

## 2013-10-16 LAB — URINALYSIS, ROUTINE W REFLEX MICROSCOPIC
Bilirubin Urine: NEGATIVE
GLUCOSE, UA: NEGATIVE mg/dL
HGB URINE DIPSTICK: NEGATIVE
Ketones, ur: NEGATIVE mg/dL
Leukocytes, UA: NEGATIVE
Nitrite: NEGATIVE
PH: 6 (ref 5.0–8.0)
PROTEIN: NEGATIVE mg/dL
Specific Gravity, Urine: 1.013 (ref 1.005–1.030)
Urobilinogen, UA: 1 mg/dL (ref 0.0–1.0)

## 2013-10-16 LAB — AMMONIA: Ammonia: 51 umol/L (ref 11–60)

## 2013-10-16 LAB — LIPASE, BLOOD: LIPASE: 54 U/L (ref 11–59)

## 2013-10-16 MED ORDER — OXYCODONE HCL 5 MG PO TABS: 5.0000 mg | ORAL_TABLET | ORAL | Status: AC | PRN

## 2013-10-16 NOTE — Discharge Instructions (Signed)
Rib Fracture °A rib fracture is a break or crack in one of the bones of the ribs. The ribs are like a cage that goes around your upper chest. A broken or cracked rib is often painful, but most do not cause other problems. Most rib fractures heal on their own in 1-3 months. °HOME CARE °· Avoid activities that cause pain to the injured area. Protect your injured area. °· Slowly increase activity as told by your doctor. °· Take medicine as told by your doctor. °· Put ice on the injured area for the first 1-2 days after you have been treated or as told by your doctor. °¨ Put ice in a plastic bag. °¨ Place a towel between your skin and the bag. °¨ Leave the ice on for 15-20 minutes at a time, every 2 hours while you are awake. °· Do deep breathing as told by your doctor. You may be told to: °¨ Take deep breaths many times a day. °¨ Cough many times a day while hugging a pillow. °¨ Use a device (incentive spirometer) to perform deep breathing many times a day. °· Drink enough fluids to keep your pee (urine) clear or pale yellow.   °· Do not wear a rib belt or binder. These do not allow you to breathe deeply. °GET HELP RIGHT AWAY IF:  °· You have a fever. °· You have trouble breathing.   °· You cannot stop coughing. °· You cough up thick or bloody spit (mucus).   °· You feel sick to your stomach (nauseous), throw up (vomit), or have belly (abdominal) pain.   °· Your pain gets worse and medicine does not help.   °MAKE SURE YOU:  °· Understand these instructions. °· Will watch your condition. °· Will get help right away if you are not doing well or get worse. °Document Released: 11/03/2007 Document Revised: 05/21/2012 Document Reviewed: 03/28/2012 °ExitCare® Patient Information ©2015 [employer]. This information is not intended to replace advice given to you by your health care provider. Make sure you discuss any questions you have with your health care provider. ° °

## 2013-10-16 NOTE — ED Provider Notes (Signed)
CSN: [id number]     Arrival date & time 10/16/13  0855 History   First MD Initiated Contact with Patient 10/16/13 [phone]     Chief Complaint  Patient presents with  . Back Pain      HPI Patient presents with pain in right rib area for last few days.  Feels and hears a popping sound when he moves or breathes.  Has history of chronic hepatitis C and liver disease.  Patient has stopped drinking.  Has no fever or chills.  Has history of rib fractures on same side. Past Medical History  Diagnosis Date  . Acid reflux   . Hep C w/ coma, chronic   . Liver damage    Past Surgical History  Procedure Laterality Date  . Upper gastrointestinal endoscopy    . Back surgery    . Anterior cruciate ligament repair  1996    x 2 left knee  . Appendectomy    . Anterior cruciate ligament repair  1995    screws placed  . Esophagogastroduodenoscopy N/A 05/26/2013    Procedure: ESOPHAGOGASTRODUODENOSCOPY (EGD);  Surgeon: Theda Belfast, MD;  Location: [facility] ENDOSCOPY;  Service: Endoscopy;  Laterality: N/A;   No family history on file. History  Substance Use Topics  . Smoking status: Current Every Day Smoker -- 2.00 packs/day for 40 years    Types: Cigarettes  . Smokeless tobacco: Never Used  . Alcohol Use: 2.4 oz/week    4 Cans of beer per week     Comment: 6-12 beers a day    Review of Systems  All other systems reviewed and are negative  Allergies  Codeine  Home Medications   Prior to Admission medications   Medication Sig Start Date End Date Taking? Authorizing Provider  furosemide (LASIX) 40 MG tablet Take 40 mg by mouth daily.   Yes Historical Provider, MD  lactulose, encephalopathy, (CHRONULAC) 10 GM/15ML SOLN Take by mouth 3 (three) times daily.   Yes Historical Provider, MD  naphazoline (CLEAR EYES) 0.012 % ophthalmic solution Place 1 drop into both eyes every morning.    Yes Historical Provider, MD  rifaximin (XIFAXAN) 550 MG TABS tablet Take 550 mg by mouth 2 (two) times daily.   Yes  Historical Provider, MD  spironolactone (ALDACTONE) 100 MG tablet Take 100 mg by mouth daily.   Yes Historical Provider, MD  thiamine (VITAMIN B-1) 100 MG tablet Take 100 mg by mouth daily.   Yes Historical Provider, MD  folic acid (FOLVITE) 1 MG tablet Take 1 tablet (1 mg total) by mouth daily. 05/27/13   Kathlen Mody, MD  oxyCODONE (OXY IR/ROXICODONE) 5 MG immediate release tablet Take 1 tablet (5 mg total) by mouth every 4 (four) hours as needed for moderate pain. 10/16/13   Nelia Shi, MD  thiamine 100 MG tablet Take 1 tablet (100 mg total) by mouth daily. 05/27/13   Kathlen Mody, MD   BP 121/78  Pulse 72  Temp(Src) 98.5 F (36.9 C) (Oral)  Resp 18  Ht  (1.854 m)  Wt 230 lb (104.327 kg)  BMI 30.35 kg/m2  SpO2 98% Physical Exam Physical Exam  Nursing note and vitals reviewed. Constitutional: He is oriented to person, place, and time. He appears well-developed and well-nourished. No distress.  HENT:  Head: Normocephalic and atraumatic.  Eyes: Pupils are equal, round, and reactive to light.  Neck: Normal range of motion.  Cardiovascular: Normal rate and intact distal pulses.   Pulmonary/Chest: No respiratory distress.  Reproducible  chest wall tenderness the right mid axillary line which is worse with palpation.  Abdominal: Normal appearance. He exhibits no distension.  Musculoskeletal: Normal range of motion.  Neurological: He is alert and oriented to person, place, and time. No cranial nerve deficit.  Skin: Skin is warm and dry. No rash noted.  Psychiatric: He has a normal mood and affect. His behavior is normal.   ED Course  Procedures (including critical care time) Labs Review Labs Reviewed  COMPREHENSIVE METABOLIC PANEL - Abnormal; Notable for the following:    Glucose, Bld 138 (*)    Albumin 3.2 (*)    All other components within normal limits  CBC WITH DIFFERENTIAL - Abnormal; Notable for the following:    RBC 3.91 (*)    Hemoglobin 10.8 (*)    HCT 32.1 (*)     RDW 17.9 (*)    Platelets 60 (*)    Monocytes Relative 15 (*)    All other components within normal limits  URINALYSIS, ROUTINE W REFLEX MICROSCOPIC  LIPASE, BLOOD  AMMONIA    Imaging Review Dg Ribs Unilateral W/chest Right  10/16/2013   CLINICAL DATA:  Right rib pain, no injury  EXAM: RIGHT RIBS AND CHEST - 3+ VIEW  COMPARISON:  05/25/2013  FINDINGS: There again noted fractures of the right eleventh and tenth ribs posteriorly. No underlying pneumothorax or other complicating factors are seen. No focal infiltrate is noted. The cardiac shadow is within normal limits.  IMPRESSION: Prior right rib fractures.  No acute complicating factors are noted.   Electronically Signed   By: Alcide Clever M.D.   On: 10/16/2013 09:40      MDM   Final diagnoses:  Closed rib fracture, right, initial encounter  Chronic liver disease        Nelia Shi, MD 10/16/13 1020

## 2013-10-16 NOTE — ED Notes (Signed)
C/o rt under breast pain with soreness. States he hears popping when he breathes. States pain is worse with lying on rt side and deep breathing. Lungs clear x 2.

## 2014-01-15 ENCOUNTER — Other Ambulatory Visit: Payer: Self-pay | Admitting: Gastroenterology

## 2014-01-15 DIAGNOSIS — K7460 Unspecified cirrhosis of liver: Secondary | ICD-10-CM

## 2014-01-15 DIAGNOSIS — R188 Other ascites: Principal | ICD-10-CM

## 2014-01-16 ENCOUNTER — Other Ambulatory Visit: Payer: Self-pay | Admitting: Gastroenterology

## 2014-01-16 ENCOUNTER — Inpatient Hospital Stay: Admission: RE | Admit: 2014-01-16 | Payer: 59 | Source: Ambulatory Visit

## 2014-01-16 DIAGNOSIS — K7460 Unspecified cirrhosis of liver: Secondary | ICD-10-CM

## 2014-01-16 DIAGNOSIS — R188 Other ascites: Principal | ICD-10-CM

## 2014-01-20 ENCOUNTER — Other Ambulatory Visit: Payer: 59

## 2015-08-08 DEATH — deceased
